# Patient Record
Sex: Female | Born: 1935 | Race: White | Hispanic: No | Marital: Married | Smoking: Never smoker
Health system: Southern US, Community
[De-identification: ages and names within clinical notes are randomized; demographics above are authoritative.]

## PROBLEM LIST (undated history)

## (undated) DIAGNOSIS — G912 (Idiopathic) normal pressure hydrocephalus: Secondary | ICD-10-CM

## (undated) DIAGNOSIS — B029 Zoster without complications: Secondary | ICD-10-CM

## (undated) DIAGNOSIS — B021 Zoster meningitis: Secondary | ICD-10-CM

## (undated) DIAGNOSIS — J8410 Pulmonary fibrosis, unspecified: Secondary | ICD-10-CM

## (undated) HISTORY — DX: Zoster meningitis: B02.1

## (undated) HISTORY — PX: BUNIONECTOMY: SHX129

## (undated) HISTORY — PX: VENTRICULOPERITONEAL SHUNT: SHX204

## (undated) HISTORY — DX: (Idiopathic) normal pressure hydrocephalus: G91.2

## (undated) HISTORY — DX: Zoster without complications: B02.9

## (undated) HISTORY — DX: Pulmonary fibrosis, unspecified: J84.10

## (undated) HISTORY — PX: REPLACEMENT TOTAL KNEE BILATERAL: SUR1225

---

## 1998-01-28 ENCOUNTER — Ambulatory Visit (HOSPITAL_COMMUNITY): Admission: RE | Admit: 1998-01-28 | Discharge: 1998-01-28 | Payer: Self-pay | Admitting: Gynecology

## 1998-08-07 ENCOUNTER — Ambulatory Visit (HOSPITAL_COMMUNITY): Admission: RE | Admit: 1998-08-07 | Discharge: 1998-08-07 | Payer: Self-pay | Admitting: Gynecology

## 1999-08-10 ENCOUNTER — Encounter: Payer: Self-pay | Admitting: Gynecology

## 1999-08-10 ENCOUNTER — Ambulatory Visit (HOSPITAL_COMMUNITY): Admission: RE | Admit: 1999-08-10 | Discharge: 1999-08-10 | Payer: Self-pay | Admitting: Gynecology

## 2000-10-28 ENCOUNTER — Encounter: Payer: Self-pay | Admitting: Gynecology

## 2000-10-28 ENCOUNTER — Ambulatory Visit (HOSPITAL_COMMUNITY): Admission: RE | Admit: 2000-10-28 | Discharge: 2000-10-28 | Payer: Self-pay | Admitting: Gynecology

## 2000-11-11 ENCOUNTER — Other Ambulatory Visit: Admission: RE | Admit: 2000-11-11 | Discharge: 2000-11-11 | Payer: Self-pay | Admitting: Gynecology

## 2000-11-16 ENCOUNTER — Encounter: Payer: Self-pay | Admitting: Gynecology

## 2000-11-16 ENCOUNTER — Encounter: Admission: RE | Admit: 2000-11-16 | Discharge: 2000-11-16 | Payer: Self-pay | Admitting: Gynecology

## 2002-01-31 ENCOUNTER — Ambulatory Visit (HOSPITAL_COMMUNITY): Admission: RE | Admit: 2002-01-31 | Discharge: 2002-01-31 | Payer: Self-pay | Admitting: Family Medicine

## 2002-01-31 ENCOUNTER — Encounter: Payer: Self-pay | Admitting: Family Medicine

## 2002-02-07 ENCOUNTER — Other Ambulatory Visit: Admission: RE | Admit: 2002-02-07 | Discharge: 2002-02-07 | Payer: Self-pay | Admitting: Family Medicine

## 2002-02-14 ENCOUNTER — Encounter: Payer: Self-pay | Admitting: Family Medicine

## 2002-02-14 ENCOUNTER — Ambulatory Visit (HOSPITAL_COMMUNITY): Admission: RE | Admit: 2002-02-14 | Discharge: 2002-02-14 | Payer: Self-pay | Admitting: Family Medicine

## 2003-02-04 ENCOUNTER — Ambulatory Visit (HOSPITAL_COMMUNITY): Admission: RE | Admit: 2003-02-04 | Discharge: 2003-02-04 | Payer: Self-pay | Admitting: *Deleted

## 2003-02-04 ENCOUNTER — Encounter: Payer: Self-pay | Admitting: *Deleted

## 2003-08-14 ENCOUNTER — Encounter: Payer: Self-pay | Admitting: Otolaryngology

## 2003-08-14 ENCOUNTER — Inpatient Hospital Stay (HOSPITAL_COMMUNITY): Admission: AD | Admit: 2003-08-14 | Discharge: 2003-08-27 | Payer: Self-pay

## 2003-08-26 ENCOUNTER — Encounter: Payer: Self-pay | Admitting: Otolaryngology

## 2003-08-29 ENCOUNTER — Encounter: Admission: RE | Admit: 2003-08-29 | Discharge: 2003-11-27 | Payer: Self-pay | Admitting: Otolaryngology

## 2003-11-28 ENCOUNTER — Encounter: Admission: RE | Admit: 2003-11-28 | Discharge: 2004-01-20 | Payer: Self-pay | Admitting: Otolaryngology

## 2003-12-03 ENCOUNTER — Ambulatory Visit (HOSPITAL_COMMUNITY): Admission: RE | Admit: 2003-12-03 | Discharge: 2003-12-03 | Payer: Self-pay | Admitting: Family Medicine

## 2004-02-05 ENCOUNTER — Other Ambulatory Visit: Admission: RE | Admit: 2004-02-05 | Discharge: 2004-02-05 | Payer: Self-pay | Admitting: Family Medicine

## 2004-07-06 ENCOUNTER — Encounter (INDEPENDENT_AMBULATORY_CARE_PROVIDER_SITE_OTHER): Payer: Self-pay | Admitting: Specialist

## 2004-07-06 ENCOUNTER — Ambulatory Visit (HOSPITAL_COMMUNITY): Admission: RE | Admit: 2004-07-06 | Discharge: 2004-07-06 | Payer: Self-pay | Admitting: Gastroenterology

## 2004-09-21 ENCOUNTER — Encounter: Admission: RE | Admit: 2004-09-21 | Discharge: 2004-12-20 | Payer: Self-pay | Admitting: Otolaryngology

## 2004-11-11 ENCOUNTER — Ambulatory Visit: Payer: Self-pay | Admitting: Internal Medicine

## 2004-11-18 ENCOUNTER — Ambulatory Visit: Payer: Self-pay | Admitting: Internal Medicine

## 2004-11-25 ENCOUNTER — Ambulatory Visit: Payer: Self-pay | Admitting: Internal Medicine

## 2004-12-02 ENCOUNTER — Ambulatory Visit: Payer: Self-pay | Admitting: Internal Medicine

## 2004-12-09 ENCOUNTER — Ambulatory Visit: Payer: Self-pay | Admitting: Internal Medicine

## 2004-12-18 ENCOUNTER — Ambulatory Visit: Payer: Self-pay | Admitting: Internal Medicine

## 2004-12-21 ENCOUNTER — Encounter: Admission: RE | Admit: 2004-12-21 | Discharge: 2005-01-19 | Payer: Self-pay | Admitting: Otolaryngology

## 2004-12-25 ENCOUNTER — Ambulatory Visit: Payer: Self-pay | Admitting: Internal Medicine

## 2005-01-08 ENCOUNTER — Ambulatory Visit: Payer: Self-pay | Admitting: Internal Medicine

## 2005-01-11 ENCOUNTER — Ambulatory Visit: Payer: Self-pay | Admitting: Internal Medicine

## 2005-01-27 ENCOUNTER — Ambulatory Visit: Payer: Self-pay | Admitting: Internal Medicine

## 2005-02-10 ENCOUNTER — Ambulatory Visit: Payer: Self-pay | Admitting: Internal Medicine

## 2005-02-18 ENCOUNTER — Ambulatory Visit: Payer: Self-pay | Admitting: Internal Medicine

## 2005-02-24 ENCOUNTER — Ambulatory Visit: Payer: Self-pay | Admitting: Internal Medicine

## 2005-03-03 ENCOUNTER — Ambulatory Visit: Payer: Self-pay | Admitting: Internal Medicine

## 2005-03-10 ENCOUNTER — Ambulatory Visit: Payer: Self-pay | Admitting: Internal Medicine

## 2005-03-26 ENCOUNTER — Ambulatory Visit: Payer: Self-pay | Admitting: Internal Medicine

## 2005-04-02 ENCOUNTER — Ambulatory Visit: Payer: Self-pay | Admitting: Internal Medicine

## 2005-04-09 ENCOUNTER — Ambulatory Visit: Payer: Self-pay | Admitting: Internal Medicine

## 2005-04-16 ENCOUNTER — Ambulatory Visit: Payer: Self-pay | Admitting: Internal Medicine

## 2005-04-22 ENCOUNTER — Ambulatory Visit: Payer: Self-pay | Admitting: Internal Medicine

## 2005-04-28 ENCOUNTER — Ambulatory Visit: Payer: Self-pay | Admitting: Internal Medicine

## 2005-05-06 ENCOUNTER — Ambulatory Visit: Payer: Self-pay | Admitting: Internal Medicine

## 2005-05-14 ENCOUNTER — Ambulatory Visit: Payer: Self-pay | Admitting: Internal Medicine

## 2005-05-19 ENCOUNTER — Ambulatory Visit: Payer: Self-pay | Admitting: Internal Medicine

## 2005-05-28 ENCOUNTER — Ambulatory Visit: Payer: Self-pay | Admitting: Internal Medicine

## 2005-05-31 ENCOUNTER — Ambulatory Visit: Payer: Self-pay | Admitting: Internal Medicine

## 2005-06-04 ENCOUNTER — Ambulatory Visit (HOSPITAL_COMMUNITY): Admission: RE | Admit: 2005-06-04 | Discharge: 2005-06-04 | Payer: Self-pay | Admitting: Family Medicine

## 2005-06-04 ENCOUNTER — Ambulatory Visit: Payer: Self-pay | Admitting: Internal Medicine

## 2005-06-17 ENCOUNTER — Ambulatory Visit: Payer: Self-pay | Admitting: Internal Medicine

## 2005-06-23 ENCOUNTER — Ambulatory Visit: Payer: Self-pay | Admitting: Internal Medicine

## 2005-07-02 ENCOUNTER — Ambulatory Visit: Payer: Self-pay | Admitting: Internal Medicine

## 2005-07-07 ENCOUNTER — Ambulatory Visit: Payer: Self-pay | Admitting: Internal Medicine

## 2005-07-16 ENCOUNTER — Ambulatory Visit: Payer: Self-pay | Admitting: Internal Medicine

## 2005-07-23 ENCOUNTER — Ambulatory Visit: Payer: Self-pay | Admitting: Internal Medicine

## 2005-07-29 ENCOUNTER — Ambulatory Visit: Payer: Self-pay | Admitting: Internal Medicine

## 2005-08-06 ENCOUNTER — Ambulatory Visit: Payer: Self-pay | Admitting: Internal Medicine

## 2005-08-17 ENCOUNTER — Ambulatory Visit: Payer: Self-pay | Admitting: Internal Medicine

## 2005-08-20 ENCOUNTER — Ambulatory Visit: Payer: Self-pay | Admitting: Internal Medicine

## 2005-08-27 ENCOUNTER — Ambulatory Visit: Payer: Self-pay | Admitting: Internal Medicine

## 2005-09-03 ENCOUNTER — Ambulatory Visit: Payer: Self-pay | Admitting: Internal Medicine

## 2005-09-10 ENCOUNTER — Ambulatory Visit: Payer: Self-pay | Admitting: Internal Medicine

## 2005-09-16 ENCOUNTER — Ambulatory Visit: Payer: Self-pay | Admitting: Internal Medicine

## 2005-09-24 ENCOUNTER — Ambulatory Visit: Payer: Self-pay | Admitting: Internal Medicine

## 2005-10-04 ENCOUNTER — Ambulatory Visit: Payer: Self-pay | Admitting: Internal Medicine

## 2005-10-15 ENCOUNTER — Ambulatory Visit: Payer: Self-pay | Admitting: Internal Medicine

## 2005-10-22 ENCOUNTER — Ambulatory Visit: Payer: Self-pay | Admitting: Internal Medicine

## 2005-11-04 ENCOUNTER — Ambulatory Visit: Payer: Self-pay | Admitting: Internal Medicine

## 2005-11-05 ENCOUNTER — Ambulatory Visit: Payer: Self-pay | Admitting: Internal Medicine

## 2005-11-11 ENCOUNTER — Ambulatory Visit: Payer: Self-pay | Admitting: Internal Medicine

## 2005-11-18 ENCOUNTER — Ambulatory Visit: Payer: Self-pay | Admitting: Internal Medicine

## 2005-11-26 ENCOUNTER — Ambulatory Visit: Payer: Self-pay | Admitting: Internal Medicine

## 2005-12-13 ENCOUNTER — Ambulatory Visit: Payer: Self-pay | Admitting: Internal Medicine

## 2005-12-23 ENCOUNTER — Ambulatory Visit: Payer: Self-pay | Admitting: Internal Medicine

## 2005-12-29 ENCOUNTER — Ambulatory Visit: Payer: Self-pay | Admitting: Internal Medicine

## 2006-01-07 ENCOUNTER — Ambulatory Visit: Payer: Self-pay | Admitting: Internal Medicine

## 2006-01-12 ENCOUNTER — Ambulatory Visit: Payer: Self-pay | Admitting: Internal Medicine

## 2006-01-21 ENCOUNTER — Ambulatory Visit: Payer: Self-pay | Admitting: Internal Medicine

## 2006-01-28 ENCOUNTER — Ambulatory Visit: Payer: Self-pay | Admitting: Internal Medicine

## 2006-02-04 ENCOUNTER — Ambulatory Visit: Payer: Self-pay | Admitting: Internal Medicine

## 2006-02-10 ENCOUNTER — Ambulatory Visit: Payer: Self-pay | Admitting: Internal Medicine

## 2006-02-18 ENCOUNTER — Ambulatory Visit: Payer: Self-pay | Admitting: Internal Medicine

## 2006-02-24 ENCOUNTER — Ambulatory Visit: Payer: Self-pay | Admitting: Internal Medicine

## 2006-03-04 ENCOUNTER — Ambulatory Visit: Payer: Self-pay | Admitting: Internal Medicine

## 2006-03-11 ENCOUNTER — Ambulatory Visit: Payer: Self-pay | Admitting: Internal Medicine

## 2006-03-22 ENCOUNTER — Ambulatory Visit: Payer: Self-pay | Admitting: Internal Medicine

## 2006-04-01 ENCOUNTER — Ambulatory Visit: Payer: Self-pay | Admitting: Internal Medicine

## 2006-04-05 ENCOUNTER — Ambulatory Visit: Payer: Self-pay | Admitting: Internal Medicine

## 2006-04-08 ENCOUNTER — Ambulatory Visit: Payer: Self-pay | Admitting: Internal Medicine

## 2006-04-13 ENCOUNTER — Ambulatory Visit: Payer: Self-pay | Admitting: Internal Medicine

## 2006-04-22 ENCOUNTER — Ambulatory Visit: Payer: Self-pay | Admitting: Internal Medicine

## 2006-04-27 ENCOUNTER — Ambulatory Visit: Payer: Self-pay | Admitting: Internal Medicine

## 2006-05-06 ENCOUNTER — Ambulatory Visit: Payer: Self-pay | Admitting: Internal Medicine

## 2006-05-18 ENCOUNTER — Ambulatory Visit: Payer: Self-pay | Admitting: Internal Medicine

## 2006-05-26 ENCOUNTER — Ambulatory Visit: Payer: Self-pay | Admitting: Internal Medicine

## 2006-06-01 ENCOUNTER — Ambulatory Visit: Payer: Self-pay | Admitting: Internal Medicine

## 2006-06-09 ENCOUNTER — Ambulatory Visit: Admission: RE | Admit: 2006-06-09 | Discharge: 2006-06-09 | Payer: Self-pay | Admitting: Family Medicine

## 2006-06-09 ENCOUNTER — Ambulatory Visit: Payer: Self-pay | Admitting: Internal Medicine

## 2006-06-14 ENCOUNTER — Ambulatory Visit: Payer: Self-pay | Admitting: Internal Medicine

## 2006-06-23 ENCOUNTER — Ambulatory Visit: Payer: Self-pay | Admitting: Internal Medicine

## 2006-06-29 ENCOUNTER — Other Ambulatory Visit: Admission: RE | Admit: 2006-06-29 | Discharge: 2006-06-29 | Payer: Self-pay | Admitting: Family Medicine

## 2006-07-01 ENCOUNTER — Ambulatory Visit: Payer: Self-pay | Admitting: Internal Medicine

## 2006-07-06 ENCOUNTER — Ambulatory Visit: Payer: Self-pay | Admitting: Internal Medicine

## 2006-07-14 ENCOUNTER — Ambulatory Visit: Payer: Self-pay | Admitting: Internal Medicine

## 2006-07-21 ENCOUNTER — Ambulatory Visit: Payer: Self-pay | Admitting: Internal Medicine

## 2006-07-28 ENCOUNTER — Ambulatory Visit: Payer: Self-pay | Admitting: Internal Medicine

## 2006-07-29 ENCOUNTER — Ambulatory Visit: Payer: Self-pay | Admitting: Internal Medicine

## 2006-08-05 ENCOUNTER — Ambulatory Visit: Payer: Self-pay | Admitting: Internal Medicine

## 2006-08-12 ENCOUNTER — Ambulatory Visit: Payer: Self-pay | Admitting: Internal Medicine

## 2006-08-17 ENCOUNTER — Ambulatory Visit: Payer: Self-pay | Admitting: Internal Medicine

## 2006-08-24 ENCOUNTER — Ambulatory Visit: Payer: Self-pay | Admitting: Internal Medicine

## 2006-09-01 ENCOUNTER — Ambulatory Visit: Payer: Self-pay | Admitting: Internal Medicine

## 2006-09-09 ENCOUNTER — Ambulatory Visit: Payer: Self-pay | Admitting: Internal Medicine

## 2006-09-16 ENCOUNTER — Ambulatory Visit: Payer: Self-pay | Admitting: Internal Medicine

## 2006-09-23 ENCOUNTER — Ambulatory Visit: Payer: Self-pay | Admitting: Internal Medicine

## 2006-10-04 ENCOUNTER — Ambulatory Visit: Payer: Self-pay | Admitting: Internal Medicine

## 2006-10-20 ENCOUNTER — Ambulatory Visit: Payer: Self-pay | Admitting: Internal Medicine

## 2006-10-28 ENCOUNTER — Ambulatory Visit: Payer: Self-pay | Admitting: Internal Medicine

## 2006-11-04 ENCOUNTER — Ambulatory Visit: Payer: Self-pay | Admitting: Internal Medicine

## 2006-11-07 ENCOUNTER — Ambulatory Visit: Payer: Self-pay | Admitting: Internal Medicine

## 2006-11-14 ENCOUNTER — Ambulatory Visit: Payer: Self-pay | Admitting: Internal Medicine

## 2006-11-25 ENCOUNTER — Ambulatory Visit: Payer: Self-pay | Admitting: Internal Medicine

## 2006-11-28 ENCOUNTER — Ambulatory Visit: Payer: Self-pay | Admitting: Internal Medicine

## 2006-12-01 ENCOUNTER — Ambulatory Visit: Payer: Self-pay | Admitting: Internal Medicine

## 2006-12-09 ENCOUNTER — Ambulatory Visit: Payer: Self-pay | Admitting: Internal Medicine

## 2006-12-15 ENCOUNTER — Ambulatory Visit: Payer: Self-pay | Admitting: Internal Medicine

## 2006-12-23 ENCOUNTER — Ambulatory Visit: Payer: Self-pay | Admitting: Internal Medicine

## 2006-12-29 ENCOUNTER — Ambulatory Visit: Payer: Self-pay | Admitting: Internal Medicine

## 2007-01-06 ENCOUNTER — Ambulatory Visit: Payer: Self-pay | Admitting: Internal Medicine

## 2007-01-11 ENCOUNTER — Ambulatory Visit: Payer: Self-pay | Admitting: Internal Medicine

## 2007-01-16 ENCOUNTER — Ambulatory Visit: Payer: Self-pay | Admitting: Internal Medicine

## 2007-01-26 ENCOUNTER — Ambulatory Visit: Payer: Self-pay | Admitting: Internal Medicine

## 2007-02-06 ENCOUNTER — Ambulatory Visit: Payer: Self-pay | Admitting: Internal Medicine

## 2007-02-15 ENCOUNTER — Ambulatory Visit: Payer: Self-pay | Admitting: Internal Medicine

## 2007-02-24 ENCOUNTER — Ambulatory Visit: Payer: Self-pay | Admitting: Internal Medicine

## 2007-02-28 ENCOUNTER — Ambulatory Visit: Payer: Self-pay | Admitting: Internal Medicine

## 2007-03-08 ENCOUNTER — Ambulatory Visit: Payer: Self-pay | Admitting: Internal Medicine

## 2007-03-16 ENCOUNTER — Ambulatory Visit: Payer: Self-pay | Admitting: Internal Medicine

## 2007-03-24 ENCOUNTER — Ambulatory Visit: Payer: Self-pay | Admitting: Internal Medicine

## 2007-03-30 ENCOUNTER — Ambulatory Visit: Payer: Self-pay | Admitting: Internal Medicine

## 2007-04-11 ENCOUNTER — Ambulatory Visit: Payer: Self-pay | Admitting: Internal Medicine

## 2007-04-17 ENCOUNTER — Ambulatory Visit: Payer: Self-pay | Admitting: Internal Medicine

## 2007-04-21 ENCOUNTER — Ambulatory Visit: Payer: Self-pay | Admitting: Internal Medicine

## 2007-04-26 ENCOUNTER — Ambulatory Visit: Payer: Self-pay | Admitting: Internal Medicine

## 2007-05-05 ENCOUNTER — Ambulatory Visit: Payer: Self-pay | Admitting: Internal Medicine

## 2007-05-10 ENCOUNTER — Ambulatory Visit: Payer: Self-pay | Admitting: Internal Medicine

## 2007-05-19 ENCOUNTER — Ambulatory Visit: Payer: Self-pay | Admitting: Internal Medicine

## 2007-05-26 ENCOUNTER — Ambulatory Visit: Payer: Self-pay | Admitting: Internal Medicine

## 2007-06-02 ENCOUNTER — Ambulatory Visit: Payer: Self-pay | Admitting: Internal Medicine

## 2007-06-09 ENCOUNTER — Ambulatory Visit: Payer: Self-pay | Admitting: Internal Medicine

## 2007-06-16 ENCOUNTER — Ambulatory Visit: Payer: Self-pay | Admitting: Internal Medicine

## 2007-06-22 ENCOUNTER — Ambulatory Visit: Payer: Self-pay | Admitting: Internal Medicine

## 2007-06-30 ENCOUNTER — Ambulatory Visit: Payer: Self-pay | Admitting: Internal Medicine

## 2007-07-07 ENCOUNTER — Ambulatory Visit: Payer: Self-pay | Admitting: Internal Medicine

## 2007-07-14 ENCOUNTER — Ambulatory Visit: Payer: Self-pay | Admitting: Internal Medicine

## 2007-07-20 ENCOUNTER — Ambulatory Visit: Payer: Self-pay | Admitting: Internal Medicine

## 2007-07-31 ENCOUNTER — Ambulatory Visit: Payer: Self-pay | Admitting: Internal Medicine

## 2007-08-10 ENCOUNTER — Ambulatory Visit: Payer: Self-pay | Admitting: Internal Medicine

## 2007-08-17 ENCOUNTER — Ambulatory Visit: Payer: Self-pay | Admitting: Internal Medicine

## 2007-08-25 ENCOUNTER — Ambulatory Visit: Payer: Self-pay | Admitting: Internal Medicine

## 2007-08-30 ENCOUNTER — Ambulatory Visit: Payer: Self-pay | Admitting: Internal Medicine

## 2007-08-30 DIAGNOSIS — J310 Chronic rhinitis: Secondary | ICD-10-CM | POA: Insufficient documentation

## 2007-08-30 DIAGNOSIS — J45909 Unspecified asthma, uncomplicated: Secondary | ICD-10-CM | POA: Insufficient documentation

## 2007-08-31 ENCOUNTER — Ambulatory Visit: Payer: Self-pay | Admitting: Internal Medicine

## 2007-09-08 ENCOUNTER — Ambulatory Visit: Payer: Self-pay | Admitting: Internal Medicine

## 2007-09-15 ENCOUNTER — Ambulatory Visit: Payer: Self-pay | Admitting: Internal Medicine

## 2007-09-22 ENCOUNTER — Ambulatory Visit: Payer: Self-pay | Admitting: Internal Medicine

## 2007-09-29 ENCOUNTER — Ambulatory Visit (HOSPITAL_COMMUNITY): Admission: RE | Admit: 2007-09-29 | Discharge: 2007-09-29 | Payer: Self-pay | Admitting: Family Medicine

## 2007-09-29 ENCOUNTER — Ambulatory Visit: Payer: Self-pay | Admitting: Internal Medicine

## 2007-10-12 ENCOUNTER — Ambulatory Visit: Payer: Self-pay | Admitting: Internal Medicine

## 2007-10-20 ENCOUNTER — Ambulatory Visit: Payer: Self-pay | Admitting: Internal Medicine

## 2007-10-23 ENCOUNTER — Encounter: Admission: RE | Admit: 2007-10-23 | Discharge: 2007-10-23 | Payer: Self-pay | Admitting: Orthopedic Surgery

## 2007-10-24 ENCOUNTER — Ambulatory Visit (HOSPITAL_BASED_OUTPATIENT_CLINIC_OR_DEPARTMENT_OTHER): Admission: RE | Admit: 2007-10-24 | Discharge: 2007-10-24 | Payer: Self-pay | Admitting: Orthopedic Surgery

## 2007-10-24 ENCOUNTER — Encounter (INDEPENDENT_AMBULATORY_CARE_PROVIDER_SITE_OTHER): Payer: Self-pay | Admitting: Orthopedic Surgery

## 2007-11-07 ENCOUNTER — Ambulatory Visit: Payer: Self-pay | Admitting: Internal Medicine

## 2007-11-17 ENCOUNTER — Ambulatory Visit: Payer: Self-pay | Admitting: Internal Medicine

## 2007-12-01 ENCOUNTER — Ambulatory Visit: Payer: Self-pay | Admitting: Internal Medicine

## 2007-12-19 ENCOUNTER — Ambulatory Visit: Payer: Self-pay | Admitting: Internal Medicine

## 2007-12-29 ENCOUNTER — Ambulatory Visit: Payer: Self-pay | Admitting: Internal Medicine

## 2008-01-12 ENCOUNTER — Ambulatory Visit: Payer: Self-pay | Admitting: Internal Medicine

## 2008-01-26 ENCOUNTER — Ambulatory Visit: Payer: Self-pay | Admitting: Internal Medicine

## 2008-02-02 ENCOUNTER — Ambulatory Visit: Payer: Self-pay | Admitting: Internal Medicine

## 2008-02-09 ENCOUNTER — Ambulatory Visit: Payer: Self-pay | Admitting: Internal Medicine

## 2008-02-15 ENCOUNTER — Ambulatory Visit: Payer: Self-pay | Admitting: Internal Medicine

## 2008-02-23 ENCOUNTER — Ambulatory Visit: Payer: Self-pay | Admitting: Internal Medicine

## 2008-03-07 ENCOUNTER — Ambulatory Visit: Payer: Self-pay | Admitting: Internal Medicine

## 2008-03-08 ENCOUNTER — Ambulatory Visit: Payer: Self-pay | Admitting: Internal Medicine

## 2008-03-21 ENCOUNTER — Ambulatory Visit: Payer: Self-pay | Admitting: Internal Medicine

## 2008-03-29 ENCOUNTER — Ambulatory Visit: Payer: Self-pay | Admitting: Internal Medicine

## 2008-04-12 ENCOUNTER — Ambulatory Visit: Payer: Self-pay | Admitting: Internal Medicine

## 2008-04-17 ENCOUNTER — Encounter: Payer: Self-pay | Admitting: Internal Medicine

## 2008-04-25 ENCOUNTER — Ambulatory Visit: Payer: Self-pay | Admitting: Internal Medicine

## 2008-05-03 ENCOUNTER — Ambulatory Visit: Payer: Self-pay | Admitting: Internal Medicine

## 2008-05-17 ENCOUNTER — Ambulatory Visit: Payer: Self-pay | Admitting: Internal Medicine

## 2008-05-24 ENCOUNTER — Ambulatory Visit: Payer: Self-pay | Admitting: Internal Medicine

## 2008-05-31 ENCOUNTER — Ambulatory Visit: Payer: Self-pay | Admitting: Internal Medicine

## 2008-06-07 ENCOUNTER — Ambulatory Visit: Payer: Self-pay | Admitting: Internal Medicine

## 2008-06-14 ENCOUNTER — Ambulatory Visit: Payer: Self-pay | Admitting: Internal Medicine

## 2008-06-27 ENCOUNTER — Ambulatory Visit: Payer: Self-pay | Admitting: Internal Medicine

## 2008-07-05 ENCOUNTER — Ambulatory Visit: Payer: Self-pay | Admitting: Internal Medicine

## 2008-07-12 ENCOUNTER — Ambulatory Visit: Payer: Self-pay | Admitting: Internal Medicine

## 2008-07-19 ENCOUNTER — Ambulatory Visit: Payer: Self-pay | Admitting: Internal Medicine

## 2008-08-02 ENCOUNTER — Ambulatory Visit: Payer: Self-pay | Admitting: Internal Medicine

## 2008-08-12 ENCOUNTER — Ambulatory Visit: Payer: Self-pay | Admitting: Pulmonary Disease

## 2008-08-12 ENCOUNTER — Ambulatory Visit: Payer: Self-pay | Admitting: Internal Medicine

## 2008-08-23 ENCOUNTER — Ambulatory Visit: Payer: Self-pay | Admitting: Internal Medicine

## 2008-08-30 ENCOUNTER — Ambulatory Visit: Payer: Self-pay | Admitting: Internal Medicine

## 2008-09-06 ENCOUNTER — Ambulatory Visit: Payer: Self-pay | Admitting: Internal Medicine

## 2008-09-18 ENCOUNTER — Ambulatory Visit: Payer: Self-pay | Admitting: Internal Medicine

## 2008-09-27 ENCOUNTER — Ambulatory Visit: Payer: Self-pay | Admitting: Internal Medicine

## 2008-09-30 ENCOUNTER — Other Ambulatory Visit: Admission: RE | Admit: 2008-09-30 | Discharge: 2008-09-30 | Payer: Self-pay | Admitting: Family Medicine

## 2008-10-02 ENCOUNTER — Ambulatory Visit (HOSPITAL_COMMUNITY): Admission: RE | Admit: 2008-10-02 | Discharge: 2008-10-02 | Payer: Self-pay | Admitting: Family Medicine

## 2008-10-07 ENCOUNTER — Ambulatory Visit: Payer: Self-pay | Admitting: Internal Medicine

## 2008-10-10 ENCOUNTER — Ambulatory Visit: Payer: Self-pay | Admitting: Pulmonary Disease

## 2008-10-10 ENCOUNTER — Ambulatory Visit: Payer: Self-pay | Admitting: Internal Medicine

## 2008-10-18 ENCOUNTER — Ambulatory Visit: Payer: Self-pay | Admitting: Internal Medicine

## 2008-10-25 ENCOUNTER — Ambulatory Visit: Payer: Self-pay | Admitting: Internal Medicine

## 2008-11-15 ENCOUNTER — Ambulatory Visit: Payer: Self-pay | Admitting: Internal Medicine

## 2008-11-20 ENCOUNTER — Ambulatory Visit: Payer: Self-pay | Admitting: Internal Medicine

## 2008-11-29 ENCOUNTER — Ambulatory Visit: Payer: Self-pay | Admitting: Internal Medicine

## 2008-12-06 ENCOUNTER — Ambulatory Visit: Payer: Self-pay | Admitting: Internal Medicine

## 2008-12-20 ENCOUNTER — Ambulatory Visit: Payer: Self-pay | Admitting: Internal Medicine

## 2008-12-27 ENCOUNTER — Ambulatory Visit: Payer: Self-pay | Admitting: Internal Medicine

## 2008-12-30 ENCOUNTER — Ambulatory Visit: Payer: Self-pay | Admitting: Surgery

## 2009-01-09 ENCOUNTER — Ambulatory Visit: Payer: Self-pay | Admitting: Internal Medicine

## 2009-01-15 ENCOUNTER — Ambulatory Visit: Payer: Self-pay | Admitting: Internal Medicine

## 2009-01-24 ENCOUNTER — Ambulatory Visit: Payer: Self-pay | Admitting: Internal Medicine

## 2009-01-31 ENCOUNTER — Ambulatory Visit: Payer: Self-pay | Admitting: Internal Medicine

## 2009-02-06 ENCOUNTER — Ambulatory Visit: Payer: Self-pay | Admitting: Internal Medicine

## 2009-02-14 ENCOUNTER — Ambulatory Visit: Payer: Self-pay | Admitting: Internal Medicine

## 2009-02-20 ENCOUNTER — Ambulatory Visit: Payer: Self-pay | Admitting: Internal Medicine

## 2009-02-21 ENCOUNTER — Ambulatory Visit: Payer: Self-pay | Admitting: Internal Medicine

## 2009-02-27 ENCOUNTER — Ambulatory Visit: Payer: Self-pay | Admitting: Internal Medicine

## 2009-03-06 ENCOUNTER — Encounter: Payer: Self-pay | Admitting: Internal Medicine

## 2009-03-07 ENCOUNTER — Ambulatory Visit: Payer: Self-pay | Admitting: Internal Medicine

## 2009-03-21 ENCOUNTER — Ambulatory Visit: Payer: Self-pay | Admitting: Internal Medicine

## 2009-03-28 ENCOUNTER — Ambulatory Visit: Payer: Self-pay | Admitting: Internal Medicine

## 2009-04-02 ENCOUNTER — Ambulatory Visit: Payer: Self-pay | Admitting: Vascular Surgery

## 2009-04-03 ENCOUNTER — Ambulatory Visit: Payer: Self-pay | Admitting: Internal Medicine

## 2009-04-11 ENCOUNTER — Ambulatory Visit: Payer: Self-pay | Admitting: Internal Medicine

## 2009-04-15 ENCOUNTER — Ambulatory Visit: Payer: Self-pay | Admitting: Internal Medicine

## 2009-04-25 ENCOUNTER — Ambulatory Visit: Payer: Self-pay | Admitting: Internal Medicine

## 2009-05-01 ENCOUNTER — Ambulatory Visit: Payer: Self-pay | Admitting: Internal Medicine

## 2009-05-09 ENCOUNTER — Ambulatory Visit: Payer: Self-pay | Admitting: Internal Medicine

## 2009-05-16 ENCOUNTER — Ambulatory Visit: Payer: Self-pay | Admitting: Internal Medicine

## 2009-05-21 ENCOUNTER — Ambulatory Visit: Payer: Self-pay | Admitting: Internal Medicine

## 2009-05-28 ENCOUNTER — Ambulatory Visit: Payer: Self-pay | Admitting: Internal Medicine

## 2009-06-06 ENCOUNTER — Ambulatory Visit: Payer: Self-pay | Admitting: Internal Medicine

## 2009-06-09 ENCOUNTER — Ambulatory Visit: Payer: Self-pay | Admitting: Internal Medicine

## 2009-06-13 ENCOUNTER — Ambulatory Visit: Payer: Self-pay | Admitting: Internal Medicine

## 2009-06-16 ENCOUNTER — Inpatient Hospital Stay (HOSPITAL_COMMUNITY): Admission: RE | Admit: 2009-06-16 | Discharge: 2009-06-20 | Payer: Self-pay | Admitting: Orthopedic Surgery

## 2009-07-16 ENCOUNTER — Encounter: Admission: RE | Admit: 2009-07-16 | Discharge: 2009-09-12 | Payer: Self-pay | Admitting: Orthopedic Surgery

## 2009-09-12 ENCOUNTER — Encounter: Payer: Self-pay | Admitting: Internal Medicine

## 2009-09-12 ENCOUNTER — Ambulatory Visit: Payer: Self-pay | Admitting: Internal Medicine

## 2009-09-24 ENCOUNTER — Telehealth: Payer: Self-pay | Admitting: Internal Medicine

## 2009-09-29 ENCOUNTER — Inpatient Hospital Stay (HOSPITAL_COMMUNITY): Admission: RE | Admit: 2009-09-29 | Discharge: 2009-10-03 | Payer: Self-pay | Admitting: Orthopedic Surgery

## 2009-11-04 ENCOUNTER — Encounter: Admission: RE | Admit: 2009-11-04 | Discharge: 2009-11-07 | Payer: Self-pay | Admitting: Orthopedic Surgery

## 2009-11-10 ENCOUNTER — Encounter: Admission: RE | Admit: 2009-11-10 | Discharge: 2010-02-08 | Payer: Self-pay | Admitting: Orthopedic Surgery

## 2009-12-05 ENCOUNTER — Ambulatory Visit (HOSPITAL_COMMUNITY)
Admission: RE | Admit: 2009-12-05 | Discharge: 2009-12-05 | Payer: Self-pay | Source: Home / Self Care | Admitting: Family Medicine

## 2010-01-22 ENCOUNTER — Ambulatory Visit: Payer: Self-pay | Admitting: Internal Medicine

## 2010-01-22 DIAGNOSIS — J42 Unspecified chronic bronchitis: Secondary | ICD-10-CM

## 2010-03-05 ENCOUNTER — Ambulatory Visit: Payer: Self-pay | Admitting: Internal Medicine

## 2010-08-05 ENCOUNTER — Encounter
Admission: RE | Admit: 2010-08-05 | Discharge: 2010-11-03 | Payer: Self-pay | Source: Home / Self Care | Attending: Family Medicine | Admitting: Family Medicine

## 2010-09-01 ENCOUNTER — Ambulatory Visit: Payer: Self-pay | Admitting: Internal Medicine

## 2010-09-02 DIAGNOSIS — G0491 Myelitis, unspecified: Secondary | ICD-10-CM

## 2010-09-02 DIAGNOSIS — G0490 Encephalitis and encephalomyelitis, unspecified: Secondary | ICD-10-CM | POA: Insufficient documentation

## 2010-10-13 ENCOUNTER — Ambulatory Visit: Payer: Self-pay | Admitting: Internal Medicine

## 2010-10-21 ENCOUNTER — Telehealth (INDEPENDENT_AMBULATORY_CARE_PROVIDER_SITE_OTHER): Payer: Self-pay | Admitting: *Deleted

## 2010-10-28 ENCOUNTER — Ambulatory Visit (HOSPITAL_COMMUNITY)
Admission: RE | Admit: 2010-10-28 | Discharge: 2010-10-28 | Payer: Self-pay | Source: Home / Self Care | Attending: Neurology | Admitting: Neurology

## 2010-11-04 ENCOUNTER — Encounter
Admission: RE | Admit: 2010-11-04 | Discharge: 2010-11-04 | Payer: Self-pay | Source: Home / Self Care | Attending: Family Medicine | Admitting: Family Medicine

## 2010-11-12 ENCOUNTER — Encounter
Admission: RE | Admit: 2010-11-12 | Discharge: 2010-12-08 | Payer: Self-pay | Source: Home / Self Care | Attending: Family Medicine | Admitting: Family Medicine

## 2010-11-28 ENCOUNTER — Encounter: Payer: Self-pay | Admitting: Family Medicine

## 2010-12-04 ENCOUNTER — Ambulatory Visit
Admission: RE | Admit: 2010-12-04 | Discharge: 2010-12-04 | Payer: Self-pay | Source: Home / Self Care | Attending: Internal Medicine | Admitting: Internal Medicine

## 2010-12-08 NOTE — Assessment & Plan Note (Signed)
Summary: ROV 6 MONTHS///KP   Primary Provider/Referring Provider:  Laurann Montana-  Deboraha Sprang  CC:  6 month follow up, pt had bilateral knee preplacement this year, and c/o dry cough prod at times white in color.  History of Present Illness: January 22, 2010- Asthma Since last here she had both knees replaced without respiratory complication. After each surgery she spent time in SNF for rehab to help her balance with vestibular issues since meningitis.  She notres some dry cough as she continues Advair. Ran out of Singulair. Dry cough, no fever, no wheeze. Denies reflux or obvious postnasal drip. Can't complete a sentence without cough. Not on an ACEI. She loves Advair.  March 05, 2010- Asthma/ Chronic bronchitis She prefers the Advair diskus over the Advair inhaler. The mdi did not help her cough, for which she uses Ricola lozenges. Perles didn't help much. Early pollen season, and now [place] pollen have bothered her. Little sense of postnasal drip or of reflux. She had no respiratory problems with her knee replacements last year.  September 01, 2010- Asthma/ Chronic bronchitis, Hx encephalitis Got both knees replaced this year- without respiratory complication. . She had flare of vestibular dysfunction with the siurgery and is in "'vestibular rehab".  Her dry hacking cough improved while on antibiotics (and what other factors) but has recurred now. It gets worse with eating cold food, worse when first wakes up. Produces only scant clear mucus, worst in the morning. Denies reflux or aspiration. Has no rescue inhaler.    Preventive Screening-Counseling & Management  Alcohol-Tobacco     Smoking Status: never  Current Medications (verified): 1)  Advair Diskus 250-50 Mcg/dose  Misc (Fluticasone-Salmeterol) .... Two Times A Day/ 2)  Singulair 10 Mg  Tabs (Montelukast Sodium) .... One Qd 3)  Evista 60 Mg  Tabs (Raloxifene Hcl) .... One Qd 4)  Zoloft 100 Mg Tabs (Sertraline Hcl) .Marland Kitchen.. 1 Once Daily 5)   Vitamin D 16109 Unit Caps (Ergocalciferol) .... Take 1 By Mouth Weekly 6)  Tylenol Arthritis Pain 650 Mg Cr-Tabs (Acetaminophen) .... Use As Directed As Needed 7)  Naproxen 500 Mg Tabs (Naproxen) .... As Needed 8)  Advil 200 Mg Tabs (Ibuprofen) .... As Needed 9)  Wellbutrin 100 Mg Tabs (Bupropion Hcl) .Marland Kitchen.. 1 Once Daily 10)  Ibuprofen 800 Mg Tabs (Ibuprofen) .Marland Kitchen.. 1 Every 4-6 Hrs As Needed  Allergies (verified): 1)  ! Codeine 2)  ! Sulfa 3)  ! 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  No ETOH use. Married with 3 children.  Risk Factors: Smoking Status: never (09/01/2010)  Review of Systems      See HPI       The patient complains of shortness of breath with activity.  The patient denies shortness of breath at rest, productive cough, non-productive cough, coughing up blood, chest pain, irregular heartbeats, acid heartburn, indigestion, loss of appetite, weight change, abdominal pain, difficulty swallowing, sore throat, tooth/dental problems, headaches, sneezing, itching, hand/feet swelling, rash, change in color of mucus, and fever.    Vital Signs:  Patient profile:   77 year old female Height:      68 inches Weight:  162.6 pounds BMI:     24.81 O2 Sat:      97 % on Room air Pulse rate:   75 / minute BP sitting:   154 / 86  (left arm)  Vitals Entered By: Renold Genta RCP, LPN (September 01, 2010 10:26 AM)  O2 Flow:  Room air CC: 6 month follow up, pt had bilateral knee preplacement this year, c/o dry cough prod at times white  in color Is Patient Diabetic? No Comments Medications reviewed with patient Renold Genta RCP, LPN  September 01, 2010 10:26 AM    Physical Exam  Additional Exam:  GENERAL:  A/Ox3; pleasant & cooperative.NAD, uses a cane HEENT:  [place]/AT, EOM-wnl, PERRLA, droop left eye ,EACs-clear, TMs-wnl, NOSE-clear, THROAT-clear & wnl., mild hoarseness. NECK:  Supple w/ fair ROM; JVD- 1-2 cm; normal carotid impulses w/o bruits; no thyromegaly or nodules palpated; no lymphadenopathy. CHEST: hard cough, wheeze especially in the right.  HEART:  RRR, no m/r/g  heard ABDOMEN:  medium build EXT: Warm bilat,  , clubbing, pulses intact, edema left foot.  Skin: no rash/lesion  Neuro-decreased hearing left ear. droop left eye, uses walker.     Impression & Recommendations:  Problem # 1:  BRONCHITIS, CHRONIC (ICD-491.9)  Worse cough recently corresponds to some wheeze.  I will try to get away fronm the dry powder of Advair. We will give a sample of Dulera 200-5 for stronger steroid component. She doesn't think she is aspirating, but with her neurodeficits it is hard to be sure.  Problem # 2:  ASTHMA (ICD-493.90) As above.  Problem # 3:  Hx of ENCEPHALITIS (ICD-323.9) Neurologic deficits impact with episodes of vertigo and her motor instability. As a result she isn't trying to go fast enough to challenge her respiratory reserve.   Medications Added to Medication List This Visit: 1)  Zoloft 100 Mg Tabs (Sertraline hcl) .Marland Kitchen.. 1 once daily 2)  Wellbutrin 100 Mg Tabs (Bupropion hcl) .Marland Kitchen.. 1 once daily 3)  Ibuprofen 800 Mg Tabs (Ibuprofen) .Marland Kitchen.. 1 every 4-6 hrs as needed  Other Orders: Est. Patient Level III (54098)  Patient Instructions: 1)  Please schedule a follow-up appointment in 1 month. 2)  Try sample Dulera 200-5, 2 puffs and rinse mouth, twice daily 3)  Use this for now instead of Advair. If it helps we will get a script. If not just go back to Advair.

## 2010-12-08 NOTE — Miscellaneous (Signed)
Summary: Injection Record / [facility]    Injection Record / [facility]    Imported By: Lennie Odor 03/31/2010 15:22:30  _____________________________________________________________________  External Attachment:    Type:   Image     Comment:   External Document

## 2010-12-08 NOTE — Assessment & Plan Note (Signed)
Summary: per pt call/cb   [facility]  CC:  follow up-cough.  History of Present Illness:  02/21/09- Asthma Did well through winter. Now 3-4 weeks dry hack. Not aware of wheeze. Mucinex-dm helps cough but makes her sleepy. Can't remember benzonatate. Using Ricola. codeine makes her nauseated. She feels this is seasonal and ok if she can control the cough. does use Advair. Not aware of reflux.  04/03/09- Asthma After difficult early Spring pollen season she now feels settled down. She prefers Advair over Symbicort. Disliked benzonatate which made her sleepy and unsteady- blames her prior herpes meningitis. Instead she is using Mucinex-DM at bedtime for occasional hacking cough.She does have variable cough most days, very scant white mucus. Anticipating knee surgery- needs clearance   For Dr Jonell Cluck CXR-  Stable COPD, RML scarring. PFT- WNL with some neurologic (?)  delay in initiation of flow.  January 22, 2010- Asthma Since last here she had both knees replaced without respiratory complication. After each surgery she spent time in SNF for rehab to help her balance with vestibular issues since meningitis.  She notres some dry cough as she continues Advair. Ran out of Singulair. Dry cough, no fever, no wheeze. Denies reflux or obvious postnasal drip. Can't complete a sentence without cough. Not on an ACEI. She loves Advair.     Current Medications (verified): 1)  Advair Diskus 250-50 Mcg/dose  Misc (Fluticasone-Salmeterol) .... Two Times A Day/ 2)  Singulair 10 Mg  Tabs (Montelukast Sodium) .... One Qd 3)  Evista 60 Mg  Tabs (Raloxifene Hcl) .... One Qd 4)  Zoloft 50 Mg  Tabs (Sertraline Hcl) .... One Qd 5)  Allergy Vaccine Gh 1:10 .... Use Per Schedule 6)  Vitamin D 16109 Unit Caps (Ergocalciferol) .... Take 1 By Mouth Weekly 7)  Tylenol Arthritis Pain 650 Mg Cr-Tabs (Acetaminophen) .... Use As Directed As Needed 8)  Naproxen 500 Mg Tabs  (Naproxen) .... As Needed 9)  Advil 200 Mg Tabs (Ibuprofen) .... As Needed  Allergies (verified): 1)  ! Codeine 2)  ! Sulfa 3)  ! 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  No ETOH use. Married with 3 children.  Risk Factors: Smoking Status: never (04/25/2008)  Review of Systems      See HPI       The patient complains of prolonged cough.  The patient denies anorexia, fever, weight loss, weight gain, vision loss, decreased hearing, hoarseness, chest pain, syncope, dyspnea on exertion, peripheral edema, headaches, hemoptysis, abdominal pain, and severe indigestion/heartburn.    Vital Signs:  Patient profile:   77 year old female Height:      68.75 inches Weight:      167 pounds BMI:     24.93 O2 Sat:      96 % on Room air Pulse rate:   107 / minute BP sitting:   138 / 80  (left arm) Cuff size:   regular  Vitals Entered By: Reynaldo Minium CMA (January 22, 2010 4:12 PM)  O2 Flow:  Room air  Physical Exam  Additional Exam:  GENERAL:  A/Ox3; pleasant & cooperative.NAD, uses a cane HEENT:  [place]/AT, EOM-wnl, PERRLA, droop left eye ,EACs-clear, TMs-wnl, NOSE-clear, THROAT-clear & wnl., mild hoarseness. NECK:  Supple w/ fair ROM; JVD- 1-2 cm; normal carotid impulses w/o bruits; no thyromegaly or nodules palpated; no lymphadenopathy. CHEST: dry cough with slight rattle, unlabored HEART:  RRR, no m/r/g  heard ABDOMEN:  Soft & nt; nml bowel sounds; no organomegaly or masses detected. EXT: Warm bilat,  , clubbing, pulses intact, edema left foot. Negative Homan's, walks  with cane Skin: no rash/lesion  Neuro-decreased hearing left ear.    Impression & Recommendations:  Problem # 1:  ASTHMA (ICD-493.90) Chronic asthmatic bronchitis. We will renew Singulair. To try away from dry powder she will try Advair HFA. Benzonatate didn't help . Mucinex helps but makes her drowsy. She has been off of allergy shots for almost a year and will stay off for now.  Medications Added to Medication List This Visit: 1)  Naproxen 500 Mg Tabs (Naproxen) .... As needed 2)  Advil 200 Mg Tabs (Ibuprofen) .... As needed  Other Orders: Est. Patient Level III (95621)  Patient Instructions: 1)  Please schedule a follow-up appointment in 1 month. 2)  Singulair refilled. 3)  Sample Advair 230/21: 2 puffs and rinse mouth twice daily. Try this instead of the dry poweer Advair for a couple of weeks to see if that helps the cough Prescriptions: SINGULAIR 10 MG  TABS (MONTELUKAST SODIUM) one qd  #90 x 3   Entered and Authorized by:   Waymon Budge MD   Signed by:   Waymon Budge MD on 01/22/2010   Method used:   Print then Give to Patient   RxID:   [id number]

## 2010-12-08 NOTE — Assessment & Plan Note (Signed)
Summary: rov ///kp   Primary Provider/Referring Provider:  Laurann Montana-  Deboraha Sprang  CC:  Follow up visit.  History of Present Illness:  04/03/09- Asthma After difficult early Spring pollen season she now feels settled down. She prefers Advair over Symbicort. Disliked benzonatate which made her sleepy and unsteady- blames her prior herpes meningitis. Instead she is using Mucinex-DM at bedtime for occasional hacking cough.She does have variable cough most days, very scant white mucus. Anticipating knee surgery- needs clearance   For Dr Jonell Cluck CXR-  Stable COPD, RML scarring. PFT- WNL with some neurologic (?)  delay in initiation of flow.  January 22, 2010- Asthma Since last here she had both knees replaced without respiratory complication. After each surgery she spent time in SNF for rehab to help her balance with vestibular issues since meningitis.  She notres some dry cough as she continues Advair. Ran out of Singulair. Dry cough, no fever, no wheeze. Denies reflux or obvious postnasal drip. Can't complete a sentence without cough. Not on an ACEI. She loves Advair.  March 05, 2010- Asthma/ Chronic bronchitis She prefers the Advair diskus over the Advair inhaler. The mdi did not help her cough, for which she uses Ricola lozenges. Perles didn't help much. Early pollen season, and now [place] pollen have bothered her. Little sense of postnasal drip or of reflux. She had no respiratory problems with her knee replacements last year.       Current Medications (verified): 1)  Advair Diskus 250-50 Mcg/dose  Misc (Fluticasone-Salmeterol) .... Two Times A Day/ 2)  Singulair 10 Mg  Tabs (Montelukast Sodium) .... One Qd 3)  Evista 60 Mg  Tabs (Raloxifene Hcl) .... One Qd 4)  Zoloft 50 Mg  Tabs (Sertraline Hcl) .... One Qd 5)  Vitamin D 16109 Unit Caps (Ergocalciferol) .... Take 1 By Mouth Weekly 6)  Tylenol Arthritis Pain 650 Mg Cr-Tabs (Acetaminophen) .... Use As Directed As Needed 7)  Naproxen 500 Mg  Tabs (Naproxen) .... As Needed 8)  Advil 200 Mg Tabs (Ibuprofen) .... As Needed  Allergies (verified): 1)  ! Codeine 2)  ! Sulfa 3)  ! 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  No ETOH use. Married with 3 children.  Risk Factors: Smoking Status: never (04/25/2008)  Past Surgical History: Bunionectomy Bilateral knee replacements  Review of Systems      See HPI       The patient complains of prolonged cough.  The patient denies anorexia, fever, weight loss, weight gain, vision loss, decreased hearing, hoarseness, chest pain, syncope, dyspnea on exertion, peripheral edema, headaches, hemoptysis, and severe indigestion/heartburn.    Vital Signs:  Patient profile:   77 year old female Height:      68.75 inches Weight:      165.13 pounds BMI:     24.65 O2 Sat:      97 % on Room air Pulse rate:   101 / minute BP sitting:   136 / 84  (left arm) Cuff size:   regular  Vitals Entered By: Reynaldo Minium CMA (March 05, 2010 2:53 PM)  O2 Flow:  Room air  Physical Exam  Additional Exam:  GENERAL:  A/Ox3; pleasant & cooperative.NAD, uses a cane HEENT:  [place]/AT, EOM-wnl, PERRLA, droop left eye ,EACs-clear, TMs-wnl, NOSE-clear, THROAT-clear & wnl., mild hoarseness. NECK:  Supple w/ fair ROM; JVD- 1-2 cm; normal carotid impulses w/o bruits; no thyromegaly or nodules palpated; no lymphadenopathy. CHEST: clear to P&A HEART:  RRR, no m/r/g  heard ABDOMEN:  Soft & nt; nml bowel sounds; no organomegaly or masses detected. EXT: Warm bilat,  , clubbing, pulses intact, edema left foot.  Skin: no rash/lesion  Neuro-decreased hearing left  ear.    Impression & Recommendations:  Problem # 1:  BRONCHITIS, CHRONIC (ICD-491.9) Chronic tracheobronchitis. She doesn't seem to be aspirating actively, but there may be some mild laryngeal incompetence that will be more evident in the future. Robitussin seems sufficient. We will refill the Advair discus.  Other Orders: Est. Patient Level III (04540)  Patient Instructions: 1)  Please schedule a follow-up appointment in 6 months. 2)  Script for advair diskus Prescriptions: ADVAIR DISKUS 250-50 MCG/DOSE  MISC (FLUTICASONE-SALMETEROL) two times a day/  #3 x 3   Entered and Authorized by:   Waymon Budge MD   Signed by:   Waymon Budge MD on 03/05/2010   Method used:   Print then Give to Patient   RxID:   [id number]  3

## 2010-12-08 NOTE — Miscellaneous (Signed)
Summary: Injection Record / [facility]    Injection Record /  Allergy    Imported By: Lennie Odor 07/10/2010 11:46:02  _____________________________________________________________________  External Attachment:    Type:   Image     Comment:   External Document

## 2010-12-08 NOTE — Assessment & Plan Note (Signed)
Summary: 1 month return/mhh   Primary Provider/Referring Provider:  Laurann Montana-  [facility]  CC:  C.  History of Present Illness: March 05, 2010- Asthma/ Chronic bronchitis She prefers the Advair diskus over the Advair inhaler. The mdi did not help her cough, for which she uses Ricola lozenges. Perles didn't help much. Early pollen season, and now [place] pollen have bothered her. Little sense of postnasal drip or of reflux. She had no respiratory problems with her knee replacements last year.  September 01, 2010-  Asthma/ Chronic bronchitis Got both knees replaced this year- without respiratory complication. . She had flare of vestibular dysfunction with the siurgery and is in "'vestibular rehab".  Her dry hacking cough improved while on antibiotics (and what other factors) but has recurred now. It gets worse with eating cold food, worse when first wakes up. Produces only scant clear mucus, worst in the morning. Denies reflux or aspiration. Has no rescue inhaler.  October 13, 2010-- Asthma/ Chronic bronchitis She says most of her issues are with dizziness, neurological, not with her breathing.  Dulera sample last visit didn't offer any real advantage, taken faithfully. Throat tickle cough. Ice cream/ cold starts her coughing. Morning cough few minutes. Scant to no clear mucus. Denies shortness of breath. Some changes with season and weather. Cough is biggest problem. Perles didn't help. Riccola helps. Very intolerant of codeine. Robitussin Dm is adequate if needed. Went back on Singulair but unsure of effect.  Doing rehab for vestibular dysfunction.          Asthma History    Initial Asthma Severity Rating:    Age range: 12+ years    Symptoms: >2 days/week; not daily    Nighttime Awakenings: 0-2/month    Interferes w/ normal activity: no limitations    SABA use (not for EIB): 0-2 days/week    Asthma Severity Assessment: Mild Persistent   Preventive Screening-Counseling &  Management  Alcohol-Tobacco     Smoking Status: never  Current Medications (verified): 1)  Advair Diskus 250-50 Mcg/dose  Misc (Fluticasone-Salmeterol) .... Two Times A Day/ 2)  Singulair 10 Mg  Tabs (Montelukast Sodium) .... One Qd 3)  Evista 60 Mg  Tabs (Raloxifene Hcl) .... One Qd 4)  Zoloft 100 Mg Tabs (Sertraline Hcl) .Marland Kitchen.. 1 Once Daily 5)  Vitamin D 16109 Unit Caps (Ergocalciferol) .... Take 1 By Mouth Weekly 6)  Tylenol Arthritis Pain 650 Mg Cr-Tabs (Acetaminophen) .... Use As Directed As Needed 7)  Naproxen 500 Mg Tabs (Naproxen) .... As Needed 8)  Advil 200 Mg Tabs (Ibuprofen) .... As Needed 9)  Wellbutrin 100 Mg Tabs (Bupropion Hcl) .Marland Kitchen.. 1 Once Daily 10)  Ibuprofen 800 Mg Tabs (Ibuprofen) .Marland Kitchen.. 1 Every 4-6 Hrs As Needed  Allergies (verified): 1)  ! Codeine 2)  ! Sulfa 3)  ! 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  No ETOH use. Married with 3 children.  Risk Factors: Smoking Status: never (10/13/2010)  Review of Systems      See HPI       The patient complains of non-productive cough.  The patient denies shortness of breath with activity, shortness of breath  at rest, productive cough, coughing up blood, chest pain, irregular heartbeats, acid heartburn, indigestion, loss of appetite, weight change, abdominal pain, difficulty swallowing, sore throat, tooth/dental problems, headaches, nasal congestion/difficulty breathing through nose, and sneezing.    Vital Signs:  Patient profile:   77 year old female Height:      68  inches Weight:      165.2 pounds BMI:     25.21 O2 Sat:      97 % on Room air Pulse rate:   84 / minute BP sitting:   150 / 80  (left arm)  Vitals Entered By: Renold Genta RCP, LPN (October 13, 2010 11:00 AM)  O2 Flow:  Room air CC: C Comments Medications reviewed with patient Renold Genta RCP, LPN  October 13, 2010 11:02 AM    Physical Exam  Additional Exam:  GENERAL:  A/Ox3; pleasant & cooperative.NAD, uses a cane HEENT:  [place]/AT, EOM-wnl, PERRLA, droop left eye ,EACs-clear, TMs-wnl, NOSE-clear, THROAT-clear & wnl., mild hoarseness. NECK:  Supple w/ fair ROM; JVD- 1-2 cm; normal carotid impulses w/o bruits; no thyromegaly or nodules palpated; no lymphadenopathy. CHEST: , wheeze especially in the right. Inspiratory squeeks across upper back HEART:  RRR, no m/r/g  heard ABDOMEN:  medium build EXT: Warm bilat,  , clubbing, pulses intact, edema left foot.  Skin: no rash/lesion  Neuro-decreased hearing left ear. droop left eye, uses walker.     Impression & Recommendations:  Problem # 1:  BRONCHITIS, CHRONIC (ICD-491.9)  Chronic bronchitis with RML scarring. She is very compliant with meds, but I'm not sure if we have anything that can make her life better.  We will let her try sample Spiriva for trial. We discussed cough control with honey.   Problem # 2:  Hx of ENCEPHALITIS (ICD-323.9) She continues physically limited. I have discussed her ability to cough effectively and to protect her airway.  Other Orders: Est. Patient Level III (81191)  Patient Instructions: 1)  Please schedule a follow-up appointment in 6 months. 2)  Try sample Spiriva once daily-see if that eases chest tightness and cough 3)  Try otc Biotine for dry mouth as needed.  4)  Ok to use Robitussin DM as needed

## 2010-12-10 NOTE — Progress Notes (Signed)
Summary: prescription  Phone Note Call from Patient Call back at Home Phone [phone]   Caller: Patient Call For: young Summary of Call: Pt states she received samples of spiriva on her last ov and it works well for her would like a prescription for this.//[facility] [address] Initial call taken by: Darletta Moll,  October 21, 2010 1:07 PM  Follow-up for Phone Call        called spoke with patient who states that the spiriva is working well for her - her morning cough is less "problematic" since beginning it - and would like a rx for this.  [facility]ing [facility] [address] and a 90day rx for [facility] mailed to her home.  CDY, i do not see in your OV note where it specifically states that she may have an rx if the med works for her.    [facility], and 90day supply printed off for CDY to sign.  pt's home address has been verified.  Follow-up by: Boone Master CNA/MA,  October 21, 2010 2:50 PM  Additional Follow-up for Phone Call Additional follow up Details #1::        mailed to patient as requested.Reynaldo Minium CMA  October 21, 2010 3:06 PM     New/Updated Medications: SPIRIVA HANDIHALER 18 MCG CAPS (TIOTROPIUM BROMIDE MONOHYDRATE) Inhale contents of 1 capsule once a day Prescriptions: SPIRIVA HANDIHALER 18 MCG CAPS (TIOTROPIUM BROMIDE MONOHYDRATE) Inhale contents of 1 capsule once a day  #90 x 3   Entered by:   Boone Master CNA/MA   Authorized by:   Waymon Budge MD   Signed by:   Boone Master CNA/MA on 10/21/2010   Method used:   Print then Mail to Patient   RxID:   [id number] SPIRIVA HANDIHALER 18 MCG CAPS (TIOTROPIUM BROMIDE MONOHYDRATE) Inhale contents of 1 capsule once a day  #30 x 0   Entered by:   Boone Master CNA/MA   Authorized by:   Waymon Budge MD   Signed by:   Boone Master CNA/MA on 10/21/2010   Method used:   Electronically to        [facility] [address] (retail)       [address]       [place], [place]  [address]       Ph: [phone]       Fax: [phone]   RxID:   [id number]

## 2010-12-11 NOTE — Miscellaneous (Signed)
Summary: Injection Record/[facility]  Injection Record/ Allergy   Imported By: Sherian Rein 03/12/2010 15:15:07  _____________________________________________________________________  External Attachment:    Type:   Image     Comment:   External Document

## 2010-12-16 NOTE — Assessment & Plan Note (Signed)
Summary: Brandi [name] ///KP   Primary Provider/Referring Provider:  Laurann Montana-  Deboraha Sprang  CC:  Follow up visit-asthma; increased dry cough..  History of Present Illness: September 01, 2010-  Asthma/ Chronic bronchitis Got both knees replaced this year- without respiratory complication. . She had flare of vestibular dysfunction with the siurgery and is in "'vestibular rehab".  Her dry hacking cough improved while on antibiotics (and what other factors) but has recurred now. It gets worse with eating cold food, worse when first wakes up. Produces only scant clear mucus, worst in the morning. Denies reflux or aspiration. Has no rescue inhaler.  October 13, 2010-- Asthma/ Chronic bronchitis She says most of her issues are with dizziness, neurological, not with her breathing.  Dulera sample last visit didn't offer any real advantage, taken faithfully. Throat tickle cough. Ice cream/ cold starts her coughing. Morning cough few minutes. Scant to no clear mucus. Denies shortness of breath. Some changes with season and weather. Cough is biggest problem. Perles didn't help. Riccola helps. Very intolerant of codeine. Robitussin Dm is adequate if needed. Went back on Singulair but unsure of effect.  Doing rehab for vestibular dysfunction.   December 04, 2010- Asthma/ Chronic bronchitis..........................Marland Kitchenhusband here Nurse-CC: Follow up visit-asthma; increased dry cough. Neither Spiriva or Dulera has affected her cough and may aggravate. Last week saw Dr Cliffton Asters.  Coughs on and off nonproductively but especially as she lies down or first wakes, or with cold food. - dry tickle. Denies chest tight or wheeze. Asks to retry Advair. Not aware of reflux. PFT normal in 2010.      Asthma History    Asthma Control Assessment:    Age range: 12+ years    Symptoms: throughout the day    Nighttime Awakenings: 0-2/month    Interferes w/ normal activity: no limitations    SABA use (not for EIB): >2 days/week  Asthma Control Assessment: Very Poorly Controlled   Preventive Screening-Counseling & Management  Alcohol-Tobacco     Smoking Status: never  Current Medications (verified): 1)  Advair Diskus 250-50 Mcg/dose  Misc (Fluticasone-Salmeterol) .... Two Times A Day/ 2)  Singulair 10 Mg  Tabs (Montelukast Sodium) .... One Qd 3)  Evista 60 Mg  Tabs (Raloxifene Hcl) .... One Qd 4)  Zoloft 100 Mg Tabs (Sertraline Hcl) .Marland Kitchen.. 1 Once Daily 5)  Vitamin D 62952 Unit Caps (Ergocalciferol) .... Take 1 By Mouth Weekly 6)  Advil 200 Mg Tabs (Ibuprofen) .... As Needed 7)  Wellbutrin 100 Mg Tabs (Bupropion Hcl) .Marland Kitchen.. 1 Once Daily 8)  Ibuprofen 800 Mg Tabs (Ibuprofen) .Marland Kitchen.. 1 Every 4-6 Hrs As Needed 9)  Spiriva Handihaler 18 Mcg Caps (Tiotropium Bromide Monohydrate) .... Inhale Contents of 1 Capsule Once A Day  Allergies (verified): 1)  ! Codeine 2)  ! Sulfa 3)  ! 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  No ETOH use. Married with 3 children.  Risk Factors: Smoking Status: never (12/04/2010)  Review of Systems      See HPI       The patient complains of non-productive cough.  The patient denies shortness of breath with activity, shortness of breath at  rest, productive cough, coughing up blood, chest pain, irregular heartbeats, acid heartburn, indigestion, loss of appetite, weight change, abdominal pain, difficulty swallowing, sore throat, tooth/dental problems, headaches, nasal congestion/difficulty breathing through nose, and sneezing.    Vital  Signs:  Patient profile:   77 year old female Height:      68 inches Weight:      164.25 pounds BMI:     25.06 O2 Sat:      95 % on Room air Pulse rate:   101 / minute BP sitting:   138 / 78  (left arm) Cuff size:   regular  Vitals Entered By: Reynaldo Minium CMA (December 04, 2010 3:52 PM)  O2 Flow:  Room air CC: Follow up visit-asthma; increased dry cough.   Physical Exam  Additional Exam:  GENERAL:  A/Ox3; pleasant & cooperative.NAD, walker HEENT:  [place]/AT, EOM-wnl, PERRLA, droop left eye ,EACs-clear, TMs-wnl, NOSE-clear, THROAT-clear & wnl., mild hoarseness. NECK:  Supple w/ fair ROM; JVD- 1-2 cm; normal carotid impulses w/o bruits; no thyromegaly or nodules palpated; no lymphadenopathy. CHEST: , wheeze especially in the right. Inspiratory squeeks transiently at left scapula HEART:  RRR, no m/r/g  heard ABDOMEN:  medium build EXT: Warm bilat,  , clubbing, pulses intact, edema left foot.  Skin: no rash/lesion  Neuro-decreased hearing left ear. droop left eye, uses walker.     Impression & Recommendations:  Problem # 1:  BRONCHITIS, CHRONIC (ICD-491.9) Her primary complaint is cough, but some audible wheeze points to an asthma pattern etiology.  Chronic asthmatic bronchits. I don't get the story that she is refluxing or having much postnasal drip. She cannot tolerate narcotics and we think that tramadol would be like that. She is aftraid of trying gabapentin for cough She is willing to have some benzonatate and likes nmucinex if needed.  We discussed CT for evaluation of possible bronchiectasis/ atypical infection Spiriva no help and will be stopoped now  Problem # 2:  Hx of ENCEPHALITIS (ICD-323.9) Her significant residual limitations, especially vestibular problems evaluated at [facility], limit her acitivity more than her cough/ respiratory complaints.   Medications Added to Medication List This Visit: 1)  Benzonatate 100 Mg Caps (Benzonatate) .Marland Kitchen.. 1-2 three times a day as  needed cough  Other Orders: Est. Patient Level IV (16109) Prescription Created Electronically [phone])  Patient Instructions: 1)  Please schedule a follow-up appointment in 4 months. 2)  OK to use your preferred cough suppressants 3)  Script sent for benzonatate perles for cough 4)  Scripts printed for Advair and Singulair 5)  Stop Spiriva since it is ineffective 6)  Consider CT scan of chest in the future 7)  cc Dr Cliffton Asters Prescriptions: BENZONATATE 100 MG CAPS (BENZONATATE) 1-2 three times a day as needed cough  #30 x prn   Entered and Authorized by:   Waymon Budge MD   Signed by:   Waymon Budge MD on 12/04/2010   Method used:   Electronically to        [facility] [address] (retail)       [address]       [place], [place]  [address]       Ph: [phone]       Fax: [phone]   RxID:   [id number] SINGULAIR 10 MG  TABS (MONTELUKAST SODIUM) one qd  #90 x 3   Entered and Authorized by:   Waymon Budge MD   Signed by:  Waymon Budge MD on 12/04/2010   Method used:   Print then Give to Patient   RxID:   [id number] ADVAIR DISKUS 250-50 MCG/DOSE  MISC (FLUTICASONE-SALMETEROL) two times a day/  #3 x 3   Entered and Authorized by:   Waymon Budge MD   Signed by:   Waymon Budge MD on 12/04/2010   Method used:   Print then Give to Patient   RxID:   [id number] BENZONATATE 100 MG CAPS (BENZONATATE) 1-2 three times a day as needed cough  #30 x prn   Entered and Authorized by:   Waymon Budge MD   Signed by:   Waymon Budge MD on 12/04/2010   Method used:   Print then Give to Patient   RxID:   [id number]

## 2010-12-30 NOTE — Miscellaneous (Signed)
Summary: Injection Financial risk analyst   Imported By: Sherian Rein 12/23/2010 14:26:45  _____________________________________________________________________  External Attachment:    Type:   Image     Comment:   External Document

## 2011-02-08 ENCOUNTER — Ambulatory Visit: Payer: Self-pay | Admitting: Rehabilitative and Restorative Service Providers"

## 2011-02-10 LAB — BASIC METABOLIC PANEL
BUN: 10 mg/dL (ref 6–23)
BUN: 5 mg/dL — ABNORMAL LOW (ref 6–23)
CO2: 29 mEq/L (ref 19–32)
Calcium: 8.3 mg/dL — ABNORMAL LOW (ref 8.4–10.5)
Calcium: 8.3 mg/dL — ABNORMAL LOW (ref 8.4–10.5)
Chloride: 105 mEq/L (ref 96–112)
Chloride: 98 mEq/L (ref 96–112)
Creatinine, Ser: 0.55 mg/dL (ref 0.4–1.2)
Creatinine, Ser: 0.6 mg/dL (ref 0.4–1.2)
GFR calc Af Amer: >60 mL/min (ref >60)
GFR calc non Af Amer: >60 mL/min (ref >60)
GFR calc non Af Amer: >60 mL/min (ref >60)
GFR calc non Af Amer: >60 mL/min (ref >60)
Glucose, Bld: 103 mg/dL — ABNORMAL HIGH (ref 70–99)
Glucose, Bld: 106 mg/dL — ABNORMAL HIGH (ref 70–99)
Glucose, Bld: 108 mg/dL — ABNORMAL HIGH (ref 70–99)
Glucose, Bld: 125 mg/dL — ABNORMAL HIGH (ref 70–99)
Potassium: 3.5 mEq/L (ref 3.5–5.1)
Potassium: 3.7 mEq/L (ref 3.5–5.1)
Sodium: 132 mEq/L — ABNORMAL LOW (ref 135–145)
Sodium: 137 mEq/L (ref 135–145)
Sodium: 138 mEq/L (ref 135–145)

## 2011-02-10 LAB — COMPREHENSIVE METABOLIC PANEL
ALT: 14 U/L (ref 0–35)
Alkaline Phosphatase: 56 U/L (ref 39–117)
CO2: 30 mEq/L (ref 19–32)
Calcium: 9.5 mg/dL (ref 8.4–10.5)
GFR calc non Af Amer: >60 mL/min (ref >60)
Glucose, Bld: 92 mg/dL (ref 70–99)
Potassium: 4 mEq/L (ref 3.5–5.1)
Sodium: 141 mEq/L (ref 135–145)

## 2011-02-10 LAB — CBC
HCT: 25.7 % — ABNORMAL LOW (ref 36.0–46.0)
HCT: 33.1 % — ABNORMAL LOW (ref 36.0–46.0)
HCT: 33.3 % — ABNORMAL LOW (ref 36.0–46.0)
Hemoglobin: 11.2 g/dL — ABNORMAL LOW (ref 12.0–15.0)
Hemoglobin: 11.3 g/dL — ABNORMAL LOW (ref 12.0–15.0)
Hemoglobin: 11.6 g/dL — ABNORMAL LOW (ref 12.0–15.0)
Hemoglobin: 8.6 g/dL — ABNORMAL LOW (ref 12.0–15.0)
Hemoglobin: 9 g/dL — ABNORMAL LOW (ref 12.0–15.0)
MCHC: 33.5 g/dL (ref 30.0–36.0)
MCHC: 33.5 g/dL (ref 30.0–36.0)
MCV: 86.6 fL (ref 78.0–100.0)
MCV: 87.6 fL (ref 78.0–100.0)
Platelets: 182 10*3/uL (ref 150–400)
Platelets: 201 10*3/uL (ref 150–400)
RBC: 2.99 MIL/uL — ABNORMAL LOW (ref 3.87–5.11)
RBC: 3.81 MIL/uL — ABNORMAL LOW (ref 3.87–5.11)
RBC: 4.04 MIL/uL (ref 3.87–5.11)
RDW: 16.1 % — ABNORMAL HIGH (ref 11.5–15.5)
RDW: 16.5 % — ABNORMAL HIGH (ref 11.5–15.5)
RDW: 16.9 % — ABNORMAL HIGH (ref 11.5–15.5)
WBC: 8.3 10*3/uL (ref 4.0–10.5)

## 2011-02-10 LAB — DIFFERENTIAL
Basophils Relative: 1 % (ref 0–1)
Eosinophils Absolute: 0.1 10*3/uL (ref 0.0–0.7)
Neutrophils Relative %: 63 % (ref 43–77)

## 2011-02-10 LAB — CROSSMATCH: ABO/RH(D): A POS

## 2011-02-10 LAB — PROTIME-INR
INR: 1.01 (ref 0.00–1.49)
Prothrombin Time: 13.2 seconds (ref 11.6–15.2)

## 2011-02-10 LAB — URINE CULTURE

## 2011-02-10 LAB — URINALYSIS, ROUTINE W REFLEX MICROSCOPIC
Bilirubin Urine: NEGATIVE
Hgb urine dipstick: NEGATIVE
Protein, ur: NEGATIVE mg/dL
Urobilinogen, UA: 0.2 mg/dL (ref 0.0–1.0)

## 2011-02-13 LAB — BASIC METABOLIC PANEL
BUN: 7 mg/dL (ref 6–23)
BUN: 8 mg/dL (ref 6–23)
Chloride: 102 mEq/L (ref 96–112)
Chloride: 102 mEq/L (ref 96–112)
Creatinine, Ser: 0.46 mg/dL (ref 0.4–1.2)
Creatinine, Ser: 0.55 mg/dL (ref 0.4–1.2)
GFR calc Af Amer: >60 mL/min (ref >60)
GFR calc non Af Amer: >60 mL/min (ref >60)
GFR calc non Af Amer: >60 mL/min (ref >60)
GFR calc non Af Amer: >60 mL/min (ref >60)
Glucose, Bld: 130 mg/dL — ABNORMAL HIGH (ref 70–99)
Glucose, Bld: 143 mg/dL — ABNORMAL HIGH (ref 70–99)
Potassium: 3.1 mEq/L — ABNORMAL LOW (ref 3.5–5.1)
Potassium: 3.8 mEq/L (ref 3.5–5.1)
Sodium: 136 mEq/L (ref 135–145)

## 2011-02-13 LAB — ABO/RH: ABO/RH(D): A POS

## 2011-02-13 LAB — URINALYSIS, ROUTINE W REFLEX MICROSCOPIC
Hgb urine dipstick: NEGATIVE
Protein, ur: NEGATIVE mg/dL
Specific Gravity, Urine: 1.016 (ref 1.005–1.030)
Urobilinogen, UA: 0.2 mg/dL (ref 0.0–1.0)

## 2011-02-13 LAB — DIFFERENTIAL
Eosinophils Absolute: 0.1 10*3/uL (ref 0.0–0.7)
Eosinophils Relative: 2 % (ref 0–5)
Lymphocytes Relative: 27 % (ref 12–46)
Lymphs Abs: 1.8 10*3/uL (ref 0.7–4.0)
Monocytes Relative: 7 % (ref 3–12)
Neutrophils Relative %: 64 % (ref 43–77)

## 2011-02-13 LAB — COMPREHENSIVE METABOLIC PANEL
ALT: 18 U/L (ref 0–35)
AST: 19 U/L (ref 0–37)
Calcium: 9.9 mg/dL (ref 8.4–10.5)
Creatinine, Ser: 0.55 mg/dL (ref 0.4–1.2)
GFR calc Af Amer: >60 mL/min (ref >60)
GFR calc non Af Amer: >60 mL/min (ref >60)
Sodium: 140 mEq/L (ref 135–145)
Total Protein: 7.2 g/dL (ref 6.0–8.3)

## 2011-02-13 LAB — CROSSMATCH
ABO/RH(D): A POS
Antibody Screen: NEGATIVE

## 2011-02-13 LAB — CBC
HCT: 27.7 % — ABNORMAL LOW (ref 36.0–46.0)
HCT: 29.8 % — ABNORMAL LOW (ref 36.0–46.0)
Hemoglobin: 10.1 g/dL — ABNORMAL LOW (ref 12.0–15.0)
MCHC: 34.3 g/dL (ref 30.0–36.0)
MCV: 91 fL (ref 78.0–100.0)
MCV: 91.8 fL (ref 78.0–100.0)
MCV: 92.4 fL (ref 78.0–100.0)
Platelets: 186 10*3/uL (ref 150–400)
Platelets: 214 10*3/uL (ref 150–400)
RDW: 15.8 % — ABNORMAL HIGH (ref 11.5–15.5)
RDW: 15.8 % — ABNORMAL HIGH (ref 11.5–15.5)
RDW: 16.2 % — ABNORMAL HIGH (ref 11.5–15.5)
WBC: 10.2 10*3/uL (ref 4.0–10.5)

## 2011-02-13 LAB — URINE CULTURE: Culture: NO GROWTH

## 2011-02-13 LAB — URINE MICROSCOPIC-ADD ON

## 2011-02-13 LAB — PROTIME-INR
INR: 1 (ref 0.00–1.49)
Prothrombin Time: 13 seconds (ref 11.6–15.2)

## 2011-03-22 ENCOUNTER — Ambulatory Visit: Payer: Self-pay | Admitting: Rehabilitative and Restorative Service Providers"

## 2011-03-23 NOTE — Assessment & Plan Note (Signed)
OFFICE VISIT   Brandi [name], Brandi [name]  DOB:  Dec 01, 1932                                       12/30/2008  CHART#:[id number]   REASON FOR VISIT:  Evaluate left leg swelling.   HISTORY:  This is a 77 year old female I am seeing at the request of Dr.  Lestine Box for evaluation of left foot swelling.  The patient states that  she began having difficulty at the end of January.  She has been seeing  Dr. Lestine Box who has performed a right bunionectomy.  He had placed her  in some mild compression stockings.  The patient does not complain of  any pain with this.  There are no aggravating or relieving factors.  She  is just alarmed at how swollen her foot has become.  It does not affect  anything past the midfoot.   REVIEW OF SYSTEMS:  GENERAL:  Negative for fevers, chills, weight gain,  weight loss.  CARDIAC:  Negative.  PULMONARY:  Positive for asthma.  GI:  Negative.  GU:  Negative.  VASCULAR:  There is swelling in her left foot.  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  ORTHO:  Positive arthritis and joint pain in her knees and tingling and  numbness in her fingers.  PSYCH:  Negative.  ENT:  Negative.  HEME:  Negative.   PAST MEDICAL HISTORY:  Asthma.   PAST SURGICAL HISTORY:  Right foot bunionectomy.   FAMILY HISTORY:  Negative for cardiovascular disease at a young age.   SOCIAL HISTORY:  She is married with 3 children.  She is retired.  Does  not smoke, has never smoked.  Does not drink alcohol.   MEDICATIONS:  Generic Zoloft, Evista, Singulair, Advair, Vitamin D,  Cipro.   ALLERGIES:  Extreme allergy to codeine and its derivatives including  Vicodin.   PHYSICAL EXAMINATION:  Vital Signs:  Blood pressure is 137/79, pulse 93.  General:  She is well-appearing, in no distress.  Cardiovascular:  Regular rate and rhythm.  HEENT:  Normocephalic,  atraumatic.  Respirations nonlabored.  Abdomen:  Soft, nontender.  Neuro:  Nonfocal.  Psych:  She is alert and x3.  Extremities:  She has palpable dorsalis  pedis and posterior tibial pulses bilaterally.  The left forefoot is  markedly edematous, there is 3+ pitting edema beginning at the midfoot.  There is minimal varicosities, no swelling above the ankle.   DIAGNOSTIC STUDIES:  Venous duplex was performed today which reveals no  evidence of deep venous thrombosis or superficial venous thrombosis in  the left leg.  The left great saphenous is competent.  Approximately 10  cm proximal to the ankle there is a large incompetent perforator to the  posterior tibial vein.   ASSESSMENT/PLAN:  Left leg swelling with incompetent perforator.   Plan:  We will try the patient in compression stockings.  I have given  her a prescription for 20-30 mmHg compression.  I think she would be a  candidate for percutaneous intervention on her incompetent perforator.  I will discuss this with Dr. Arbie Cookey and have the patient come back to see  him in 3 months.   Jorge Ny, MD  Electronically Signed   VWB/MEDQ  D:  12/30/2008  T:  12/31/2008  Job:  [id number]   cc:   Larina Earthly, M.D.  Leonides Grills, M.D.

## 2011-03-23 NOTE — Op Note (Signed)
NAME:  Brandi [name], Brandi [name] NO.:  [id number]   MEDICAL RECORD NO.:  [id number]          PATIENT TYPE:  AMB   LOCATION:  [facility]                          FACILITY:  [facility]   PHYSICIAN:  Leonides Grills, M.D.     DATE OF BIRTH:  18-Aug-1933   DATE OF PROCEDURE:  10/24/2007  DATE OF DISCHARGE:                               OPERATIVE REPORT   PREOPERATIVE DIAGNOSES:  1. Right hallux valgus.  2. Right second metatarsalgia.  3. Right second hammertoe.   POSTOPERATIVE DIAGNOSES:  1. Right hallux valgus.  2. Right second metatarsalgia.  3. Right second hammertoe.   OPERATIONS:  1. Right Chevron bunionectomy.  2. Stress x-rays right foot.  3. Right second Weil metatarsal shortening osteotomy.  4. Right second toe MTP joint dorsal capsulotomy collateral release.  5. Right second toe proximal phalanx head resection.  6. Right second toe FDL to proximal phalanx tendon transfer.  7. Right second toe EDB to EDL tendon transfer.   ANESTHESIA:  General.   SURGEON:  Leonides Grills, M.D.   ASSISTANT:  Evlyn Kanner, P.A.-C.   ESTIMATED BLOOD LOSS:  Minimal.   TOURNIQUET TIME:  Approximately an hour.   COMPLICATIONS:  None.   DISPOSITION:  Stable to the PR.   INDICATIONS:  This is a 77 year old very pleasant female who has had  persistent forefoot pain due to the above deformity and failed  conservative management which included wider shoes and extra-depth  shoes.  She was consented for the above procedure.  All risks, which  include infection, nerve or vessel injury, nonunion, malunion, hardware  tissue, hardware failure, persistent pain, worse pain, prolonged  recovery, stiffness, arthritis, recurrence of deformity, hallux varus,  and cock-up toe deformity were all explained.  Questions were encouraged  and answered.   OPERATIVE NOTE:  The patient was brought to the operating room and  placed in the supine position.  After adequate general endotracheal tube  anesthesia was  administered, as well as Ancef 1 g IV piggyback, the  right lower extremity was then prepped and draped in a sterile manner  over a proximally placed thigh tourniquet.  The limb was gravity saline.  The tourniquet was elevated to 290 mmHg.  A longitudinal incision was  made in line over the medial aspect of the right great toe MTP joint was  then made.  Dissection was carried down through the skin.  Hemostasis  was obtained.  Neurovascular structures were identified both superiorly  and inferiorly and protected throughout the case.  An L-shaped  capsulotomy was then made.  A bunionectomy was then performed with a  sagittal saw.  Alona Bene ridge was then rounded off with a rongeur.  The lateral capsule was then released with a curved Beaver blade.  This  had an excellent release of tight capsule.  The center of the head was  then identified, and a chevron osteotomy was then created with a  sagittal saw.  The head was then translated approximately 3-4 mm  laterally and then fixed with a 2 mm fully threaded cortical set screw  using a 1.5 mm  drill hole, respectively.  This had excellent purchase  and maintenance of the correction.  Redundant bone medially was trimmed  off with the sagittal saw.  Joint and area were copiously irrigated with  normal saline.  Capsule was advanced both superior and proximally and  reconstructed with 2-0 Vicryl stitch.  This had an Conservation officer, historic buildings.  Stress x-rays were obtained in the AP lateral planes that showed no  gross motion across the osteotomy site.  Fixation proposition and  excellent alignment as well.  Range of motion of the toe was excellent  as well.  We then made a longitudinal incision in the dorsal aspect of  the right second toe.  Dissection was carried down through skin.  Hemostasis was obtained.  EDB and EDL tendons were identified.  EDL  tendon was tenotomized proximal medial and brevis distal lateral and  retracted out of harm's way for  later transfer.  Dorsal capsulotomy with  collateral release was then performed with a 15 blade scalpel,  protecting soft tissues both medially and laterally.  The distal aspect  of the proximal phalanx was then skeletonized and carefully dissected  out, and the head was then removed with a rongeur, followed by a bone  cutter.  This cut was made perpendicular to the long axis of the  proximal phalanx.  A longitudinal incision was made in the plantar  plate, and the FDL tendon was then identified.  This was tenotomized as  distal as possible and left in the wound for later transfer.  We then  plantar flexed the proximal phalanx completely, and then with a stacked  sagittal saw blade performed a Weil metatarsal shortening osteotomy.  The head was then translated and shortened approximately 3 mm and then  fixed with a 1.5 mm fully threaded cortical set screw using a 1.1 mm  drill hole, respectively.  This was 12 mm in length.  This had excellent  purchase and maintenance of the correction.  Redundant bone ledge  dorsally was then trimmed off with a rongeur.  Bone wax was applied to  all exposed bony surfaces.  We then went back to the PIP joint, and then  with the FPL tendon prepared we drilled 2.5 and 3.5 mm drill holes in  the base of the proximal phalanx.  Then, with 3-0 PDS stitch, the FDL  tendon was then pulled through the drill from plantar to dorsal.  A  0.062 K-wire was then fired antegrade through the middle and distal  phalanx, reducing the PIP joint, and then fired this retrograde, with  the toe held in reduced position, tension placed on the FDL tendon  through the drill hole, with the ankle in reduced dorsiflexion.  K-wire  was then fired across the MTP joint.  This had excellent purchase and  maintenance of the correction, and the area and joint were copiously  irrigated with normal saline.  We then reconstructed and re-created the  dorsal extension expansion unit using 3-0 PDS  stitch, and we transferred  the EDB to the EDL tendon using 3-0 PDS stitch and sewed this to the  stump of the FDL tendon.  This had an Conservation officer, historic buildings.  Tourniquet  was deflated.  Hemostasis was obtained.  Toe pinked up beautifully.  Final x-rays were obtained in AP and lateral planes and showed no gross  motion across the osteotomy site, fixation in the proper position and  excellent as well.  Shortening was excellent, and the K-wire was in the  proper position.  Skin-relieving incisions were made on either side of  the K-wire. K-wires then cut, capped, and the toe pinked up nicely, and  was no pulsatile bleeding.  Subcu was closed with 3-0 Vicryl.  Skin was  closed with 4-0 nylon.  Sterile dressing was applied.  Roger Mann  dressing was applied.  A hard sole shoe was applied.  The patient was  stable to the PR.      Leonides Grills, M.D.  Electronically Signed    PB/MEDQ  D:  10/24/2007  T:  10/25/2007  Job:  [id number]

## 2011-03-23 NOTE — Consult Note (Signed)
NEW PATIENT CONSULTATION   [name], Brandi L  DOB:  1933/08/16                                       04/02/2009  GMWNU#:[id number]   The patient presents today for continued followup of swelling in her  left foot.  She had seen Dr. Myra Gianotti in our office for evaluation of  this in February of 2010.  At that time she underwent venous duplex  which showed no evidence of DVT.  Her only abnormal findings was that of  a medial perforator in the mid calf.  She was placed in knee high  graduated compression stocking.  She has resolved the swelling.  She  reports that the stocking aggravated her left foot and caused some  protrusion of the medial first metatarsal.  This has resolved her  swelling.  She has never had any pain associated with the swelling in  her left foot.  She does have total knee replacement bilaterally by Dr.  Valentina Gu.  She has completely normal arterial pulses and should not have any  vascular issue that would preclude her from surgery or impede her  healing.   I instructed the patient to continue her current treatment.  I have also  explained that she should try to discontinue the stockings to see if she  has any resumption of swelling and that this may have been a self-  limited problem that has completely resolved without the stockings.  I  also explained that she might try a lesser grade of stockings that would  not pinch her toes.  She was comfortable with the discussion and will  see [place] again on an as-needed basis.   Larina Earthly, M.D.  Electronically Signed   TFE/MEDQ  D:  04/02/2009  T:  04/03/2009  Job:  [id number]   cc:   Stacie Acres. Cliffton Asters, M.D.  Leonides Grills, M.D.  Mila Homer. Sherlean Foot, M.D.

## 2011-03-23 NOTE — Discharge Summary (Signed)
NAME:  Brandi [name], Brandi [name] NO.:  [id number]   MEDICAL RECORD NO.:  [id number]          PATIENT TYPE:  INP   LOCATION:  5031                         FACILITY:  [facility]   PHYSICIAN:  Mila Homer. Sherlean Foot, M.D. DATE OF BIRTH:  1933-06-14   DATE OF ADMISSION:  06/16/2009  DATE OF DISCHARGE:  06/20/2009                               DISCHARGE SUMMARY   ADMISSION DIAGNOSIS:  Osteoarthritis of the left knee.   DISCHARGE DIAGNOSES:  1. Osteoarthritis of the left knee.  2. Status post left knee arthroplasty.  3. Acute blood loss anemia, status post surgery.  4. Vestibular balance issues, status post meningitis.   PROCEDURE:  Left total knee arthroplasty.   HISTORY:  The patient is a 77 year old female who presents with pain in  her left knee times several years.  The pain is constant, dull achy pain  that is interfering with activities of daily living.  Conservative  treatment has failed.  The risks and benefits were discussed with the  patient and the patient would like to proceed with a left TKA.   ALLERGIES:  1. CODEINE.  2. DARVOCET.   ADMISSION MEDICATIONS:  Upon admission to the hospital, the patient was  taking;  1. Evista 60 mg daily.  2. Singulair 10 mg daily.  3. Sertraline HCL 50 mg daily.  4. Advair inhaler twice a day.  5. Vitamin for women daily.  6. Ibuprofen as needed.   HOSPITAL COURSE:  This is a 77 year old female admitted on June 16, 2009, after appropriate laboratory studies were obtained preoperatively,  as well as Ancef on-call to the operating room.  She was taken to the OR  where she underwent a left TKA.  The patient tolerated the procedure  well and was taken to the PACU in stable condition.  She was started on  p.o. pain meds of Dilaudid, as well as Dilaudid IV as needed for severe  pain and Foley was placed in short stay.   Postop day #1, the patient's vital signs were stable.  The patient  denied chest pain, shortness of breath or calf  pain.  The patient was  started on Lovenox 30 mg subcu q.12 hours at 8:00 a.m.  Consults to PT,  OT and Care Management were made.  The patient was weightbearing as  tolerated and was in the CPM 0 to 90 degrees 6-8 hours per day.  Incentive spirometry was taught.  The patient was progressing very  slowly with physical therapy because of her previous vestibular issues.   Postop day #2, the patient continued to progress slowly with physical  therapy.  The dressing was changed.  Marcaine pump and her Hemovac were  discontinued.  The Foley was actually discontinued and then replaced  because of incontinence issues.  The patient was continued on p.o. pain  meds.  The patient continued to work with physical therapy, but  struggled a bit.  Postop day #3, the patient again was struggling with  physical therapy, so at this time we got an FL-2 form and sent her out  for SNF placement.  The patient  agreed with plan because vestibular  issues was making physical therapy very, very hard.  She was progressing  good with her knee, but the problem seemed to be her dizziness.  Postop  day #4, the patient continued with physical therapy very slowly and was  discharged to the nursing facility.   While at the hospital the patient's labs remained stable.  She did drop  on postop day #2 to 3.1 potassium, where she was given KCL potassium by  mouth and quickly rose to 4.0 the next a.m. and it has been stable ever  since.  All other values were within normal limits.  The patient was  anemic with her hemoglobin dropping to 9.4, and then stable to 9.5 on  postop day #3.  It will continue to rise.   DISCHARGE INSTRUCTIONS:  There are no restrictions to diet.  She is to  follow the instruction sheet for wound care.  Increase activity slowly.  May use a cane or walker.  She is weightbearing as tolerated.  No  lifting or driving for 6 weeks.  She is to go to the skilled nursing  facility.  Physical therapy, the  patient is to be in CPM 0 to 90 degrees  6-8 hours a day x2 weeks and is to wear thigh-high compression stockings  bilaterally x3 weeks.  She may shower in two days if no drainage.  She  should not submerge the wound in any water.   Upon discharge, the patient was given prescriptions for;  1. Lovenox 40 mg inject once subcu daily, last dose being June 30, 2009.  2. Robaxin 500 mg 1-2 tablets every 6-8 hours as needed for pain.  3. The patient is allergic to codeine, so we are going to try her on      __________50 mg 1-2 tablets every 4-6 hours as needed for pain.  If      this is not available, she also has a prescription for Dilaudid 2      mg 1 tablet every 4 hours as needed for pain, so she should get      either or.  She should not get both at the same time.   The patient will follow with Dr. Sherlean Foot on July 01, 2009.  She is to  call for appointment [phone].  The patient is discharged in improved  condition.     ______________________________  [name], PA-C    ______________________________  Mila Homer. Sherlean Foot, M.D.    MJ/MEDQ  D:  06/20/2009  T:  06/20/2009  Job:  [id number]

## 2011-03-23 NOTE — Procedures (Signed)
DUPLEX DEEP VENOUS EXAM - LOWER EXTREMITY   INDICATION:  Left foot edema, 1 month.   HISTORY:  Edema:  Left foot, 1 month.  Trauma/Surgery:  Right foot, [employer] bunionectomy in 10/2007.  Pain:  Patient complains of no pain in the left leg.  PE:  No.  Previous DVT:  Patient has a history of right leg superficial venous  thrombus 12 years ago.  Anticoagulants:  No.  Other:   DUPLEX EXAM:                CFV   SFV   PopV  PTV    GSV                R  L  R  L  R  L  R   L  R  L  Thrombosis    o  o     o     o      o     o  Spontaneous   +  +     +     +      +     +  Phasic        +  +     +     +      +     +  Augmentation  +  +     +     +      +     +  Compressible  +  +     +     +      +     +  Competent     +  +     +     +      +     +   Legend:  + - yes  o - no  p - partial  D - decreased   IMPRESSION:  1. No evidence of deep or superficial vein thrombus on the left leg.  2. No evidence of left leg baker's cyst.  3. No evidence of left greater saphenous vein incompetence.  4. No evidence of deep vein incompetence.  5. 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    _____________________________  V. Charlena Cross, MD   MC/MEDQ  D:  12/30/2008  T:  12/30/2008  Job:  [id number]

## 2011-03-23 NOTE — Op Note (Signed)
NAME:  Brandi [name], Brandi [name] NO.:  [id number]   MEDICAL RECORD NO.:  [id number]          PATIENT TYPE:  INP   LOCATION:  5031                         FACILITY:  [facility]   PHYSICIAN:  Mila Homer. Sherlean Foot, M.D. DATE OF BIRTH:  [birth year]   DATE OF PROCEDURE:  DATE OF DISCHARGE:                               OPERATIVE REPORT   SURGEON:  Mila Homer. Sherlean Foot, MD   ASSISTANT:  1. [name], PA-C  2. Skip Mayer, PA-C   ANESTHESIA:  General.   PREOPERATIVE DIAGNOSIS:  Left knee osteoarthritis.   POSTOPERATIVE DIAGNOSIS:  Left knee osteoarthritis.   PROCEDURE:  Left total knee arthroplasty.   INDICATIONS FOR PROCEDURE:  The patient is a 77 year old white female  with failure of conservative measures for osteoarthritis of the left  knee with an extreme valgus knee.  Informed consent was obtained.   DESCRIPTION OF PROCEDURE:  The patient was laid supine and administered  general anesthesia.  After Foley catheter was placed, the left knee was  then prepped and draped in the usual sterile fashion.  A midline  incision was made with a #10 blade after exsanguination of the leg and  elevation of the tourniquet to 350 mmHg.  I then used a clean blade to  perform synovectomy and elevated deep MCL of the tibia, but not going  down to the pes tendon since this was severe valgus knee.  I everted the  patella and measured 22 mm thick.  I reamed down 9 mm and drilled  through lug holes with the prosthetic trial in place and recreated a 22-  mm thickness.  Then, we went to 9 degrees flexion subluxing the patella  laterally.  It used extramedullary alignment system on tibia to make  perpendicular cut to the anatomic axis of the tibia.  I made a 5  degrees' posterior slope.  I then used the intramedullary system on the  femur in the distal femur placing the intramedullary guide on 4 degrees  since this was severe valgus knee.  I pinned the cutting block in place  and made the distal  femoral cut with sagittal saw.  I then marked out  the epicondylar axis and posterior condylar angle measured 5 degrees.  I  sized to size E, pinned to 5-degree external rotation hole.  I then  placed the formal cutting block to the distal femur and made the  anterior, posterior, and chamfer cuts with sagittal saw.  I removed  trial, removed the cut surfaces of bone with the cutting block.  Placed  a lamina spreader in the knee, was severely tied on the lateral side.  I  pie-crusted the entirety of the IT band and removed the popliteus  tendon.  This afforded excellent balance.  I then finished the tibia  with a size 4 tibial tray drilling keel, femur with a size E femur and  then trialed E femur, 4 tibia, 10 and 12 insertions.  We chose a 12  insert at full extension and flexion easily beyond 130, patella tracked  well.  I then removed the trial components and copiously  irrigated.  I  then cemented and components removing all excess cement allowing the  cement to harden in extension.  I placed Hemovac coming out  superolaterally deep to arthrotomy and pain catheter coming out  supermedial and superficial to the arthrotomy.  I let tourniquet down  and cement was hardened in 75 minutes.  I copiously irrigated and then  closed the arthrotomy with figure-of-eight #1 Vicryl sutures, the deep  soft tissues with buried 0 Vicryl suture, subcuticular 2-0 Vicryl  stitches, skin staples.  Dressed with Xeroform dressing sponges, sterile  Webril, and TED stocking.   COMPLICATIONS:  None.   DRAINS:  One Hemovac and one pain catheter.   ESTIMATED BLOOD LOSS:  300 mL.   TOURNIQUET TIME:  75 minutes.           ______________________________  Mila Homer Sherlean Foot, M.D.     SDL/MEDQ  D:  06/16/2009  T:  06/17/2009  Job:  [id number]

## 2011-03-26 NOTE — H&P (Signed)
NAME:  Brandi [name], Brandi [name] NO.:  [id number]   MEDICAL RECORD NO.:  [id number]                   PATIENT TYPE:  INP   LOCATION:  1828                                 FACILITY:  [facility]   PHYSICIAN:  Hermelinda Medicus, M.D.                DATE OF BIRTH:  01-25-1933   DATE OF ADMISSION:  08/14/2003  DATE OF DISCHARGE:                                HISTORY & PHYSICAL   HISTORY OF PRESENT ILLNESS:  This patient is a 77 year old female who has  had approximately six days of antibiotic therapy using Levaquin and  Augmentin for a severe left ear infection where she was having a tingling  pain originally in her external ear canal developing into more of an  extensive pain in the mastoid and then developing into a headache type pain  which was somewhat excruciating approximately 2-3 days ago.  She was seen on  two different occasions, had quite a severe otitis media appearance,  erythema of the tympanic membrane, and was changed from Levaquin to  Augmentin.  She continued to have difficulties, and was also then began to  have some vertigo and difficulty walking keeping her balance, and was seen  in my office late yesterday afternoon where, on examination, she had bright  red tympanic membrane on the left side with an, otherwise, normal ENT  examination.  Her throat and nose were clear.  The right ear looked  excellent.  She had somewhat of a pressure on the left side, and  considerable pain with the difficulty with the imbalance.  She had no facial  nerve abnormalities, and no cranial nerve abnormalities.  EOMs were within  normal limits.  She underwent a myringotomy under my care, draining some  clear fluid, and she, last evening when I contacted the family, was doing  much better.  However, this morning she developed a third nerve palsy with a  diplopia with a slight horizontal diplopia here and a gait disorder that  seems to be more of a problem.  Therefore, when I  checked on her this  morning we brought her to the emergency room immediately for further  observation and a MRI and MRA.   She had a complete evaluation immediately by Dr. Sharene Skeans, and it did show  positive diplopia with a gait disorder.  Her hearing is really quite within  normal limits as it was last night except she does have some conductive loss  secondary to fluid behind the tympanic membrane.  Her medications given last  night were Valtrex and prednisone.  The Valtrex was 1500 mg last night,  prednisone 5 mg take six tablets.   PHYSICAL EXAMINATION:  VITAL SIGNS:  Temperature is within normal limits.  HEENT:  The ear today looks much improved with considerable resolution of  erythema on the left and the right is within normal limits.  The nose and  throat are clear.  She does have the diplopia with a slight nystagmus as  seen by Dr. Sharene Skeans, and also the double vision.  She has a small  irritation on the superior pinna of her left ear also.  NECK:  She has no evidence of stiffness of her neck or other neurologic  meningitis type symptoms.  LUNGS:  Clear.  No rales, rhonchi, or wheezes.  CARDIOVASCULAR:  No murmurs, rubs, or gallops.  ABDOMEN:  Essentially unremarkable.  NEUROLOGICAL:  The neurologic examination was completely done by Dr. Billie Ruddy which is in his notes.   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  MRI was  done which we reviewed with the neuroradiologist and with Dr. Sharene Skeans, and  it appears to be quite within normal limits.    PLAN:  We are continuing her therapy.  Dr. Sharene Skeans is planning to do a LP,  and the plan is to do acyclovir 10 mg/kg q.8 h. and Rocephin 2 g q.24 h.                                                Hermelinda Medicus, M.D.    JC/MEDQ  D:  08/14/2003  T:   08/14/2003  Job:  [id number]   cc:   Deanna Artis. Sharene Skeans, M.D.  [address]  [address]  [place]  [place] [address]  Fax: [phone]   Meredith Staggers, M.D.  [address], [address]  [place]  [place] [address]  Fax: [phone]

## 2011-03-26 NOTE — Consult Note (Signed)
NAME:  Brandi [name], Brandi [name]                        ACCOUNT NO.:  [id number]   MEDICAL RECORD NO.:  [id number]                   PATIENT TYPE:  EMS   LOCATION:  MAJO                                 FACILITY:  [facility]   PHYSICIAN:  Deanna Artis. Sharene Skeans, M.D.           DATE OF BIRTH:  Jan 27, 1933   DATE OF CONSULTATION:  08/14/2003  DATE OF DISCHARGE:                                   CONSULTATION   CHIEF COMPLAINT:  Diplopia, gait disorder, otitis media.   HISTORY OF PRESENT CONDITION:  The patient is a 77 year old right-handed,  married Caucasian woman who had onset of symptoms about 10 days ago that  started as a dull ache deep inside her left face anterior to her ear.  The  pain gradually intensified and became sharper involving the ear.  The  patient was unable to sleep.  She sought care and a diagnosis of otitis  media was made.  She was given three doses of Levaquin.  When her symptoms  continued to intensify she was seen again and placed on Augmentin.  When she  continued to have symptoms, she requested consultation with Dr. Hermelinda Medicus who evaluated her and found evidence of vesicles in the left  external auditory canal and on the tympanic membrane as well as a small  crusted lesion up on the superior portion of the pinna of the left ear.  There was an inflamed tympanic membrane with fluid behind it.  Myringotomy  was performed with release of a small amount of fluid.  The patient was  given Valtrex orally and also oral Prednisone.  Symptomatically she felt  somewhat better today.  Nonetheless when she awakened this morning she had  diplopia and difficulty walking.  She felt that her hearing was diminished  in the left side.  She was brought to the hospital for assessment by Dr. Esaw Dace who asked me to see the patient to consider the possibility of a  brain stem encephalitis.   The patient had severe headache about two days ago that was migrainous in  nature.  She has had  no headache today, no stiff neck, confusion, fever or  other constitutional symptoms.  Review of systems is negative except as  noted above.   PAST MEDICAL HISTORY:  The patient was previously healthy, has not been in  the hospital since she had childbirth.  She is a gravida 4, para 3.   PAST SURGICAL HISTORY:  None.   CURRENT MEDICATIONS:  Valtrex and Prednisone.   ALLERGIES:  No drug allergies.   FAMILY HISTORY:  Noncontributory.   SOCIAL HISTORY:  The patient is married, she is retired.  She is a  nonsmoker.   PHYSICAL EXAMINATION:  VITAL SIGNS: On examination today blood pressure is  147/74, resting pulse 95, respirations 20, temperature 98.6.  HEENT: Shows a left otitis media with vesicals in the external auditory  canal and on the  tympanic membrane with a small amount of blood where the  myringotomy was done, the tympanic membrane is inflamed but not bulging.  There is a crusted area in the superior pinna, no lymph nodes, no  meningismus, no bruits were appreciated.  LUNGS: Clear to auscultation.  HEART: No murmurs.  Pulses normal.  ABDOMEN: Soft, bowel sounds normal.  EXTREMITIES: Unremarkable.  NEUROLOGIC EXAMINATION: Awake, alert, normal language, fund of knowledge and  memory.  Cranial nerves - round reactive pupils. Visual fields full to  double simultaneous stimuli. The patient has vast component nystagmus to the  right.  No complaints of vertigo.  She has skew diplopia.  However with red  lens test paradoxically the diplopia disappeared.  The degree of skew  Maintained the same despite visual excursion throughout all fields of gaze.  The patient does not have obvious weakness in her extraocular movements  despite her diplopia.  The patient is squinting with her left eye.  She has  midline tongue, air conduction greater than bone conduction.  Midline Weber  air conduction is greater in the right ear than the left ear.  Symmetric  facial strength.  Midline tongue and  uvula noted.  Motor examination -  normal strength, tone and mass.  Good fine motor movements, no pronator  drift.  Sensation intact to cold vibrations, stereognosis.  Cerebellar  examination - good finger-to-nose, rapid fine motor movements.  Gait - she  tends to list a bit to the left.  Gait is broad based, negative Romberg.  Deep tendon reflexes are normal.  The patient had bilateral flexor plantar  responses.   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  There are no other signs of  encephalitis at this point.   PLAN:  1. MRI of the brain without and with contrast.  2. Lumbar puncture.  3. Acyclovir 10 mg/kg q.8h.  4. Rocephin 2 grams q.24h.   I appreciate the opportunity to participate in her care and will continue to  follow along with you.                                                  Deanna Artis. Sharene Skeans, M.D.    [facility]  D:  08/14/2003  T:  08/14/2003  Job:  [id number]   cc:   Hermelinda Medicus, M.D.  [address]  [place] [address]  Fax: [phone]   [facility]

## 2011-03-26 NOTE — Assessment & Plan Note (Signed)
[facility]                             PULMONARY OFFICE NOTE   NAME:Brandi [name], Brandi [name]                     MRN:          [id number]  DATE:12/01/2006                            DOB:          1933-05-13    PROBLEM:  Asthma with cough.   HISTORY:  I last saw her over a year ago.  She saw the nurse  practitioner in November with an exacerbation of sinusitis and  bronchitis that apparently responded to a Z-Pak followed by a course of  Omnicef.  She now feels fine denying any congestion, discharge, or  cough.  She has had flu vaccine for this year and has had Pneumococcal  vaccine at least once.  She will check with her primary physician as to  how many times she has actually had it.   MEDICATIONS:  She continues allergy vaccine at 1 to 10 with no problems,  getting injections here.  Advair 250/50, Singular 10 mg, Evista 60 mg,  Zoloft 50 mg, vitamin D, p.r.n. use of Tylenol, ibuprofen, rescue  Albuterol.   DRUG INTOLERANCE:  CODEINE, SULFA, AND VICODIN.   OBJECTIVE:  VITAL SIGNS:  Weight 164 pounds, blood pressure 152/82,  pulse rate 75, room air saturation 97%.  GENERAL:  She looks very comfortable.  HEENT:  Eyes, nose, and throat are clear.  LUNGS:  Clear to P&A.  There is no hoarseness and no cough.  HEART:  Sounds are regular without murmur.   IMPRESSION:  Stable and doing well.   PLAN:  She will continue current therapy.  Schedule to return in one  year, earlier p.r.n.  Advair and Singular were refilled.     Clinton D. Maple Hudson, MD, Tonny Bollman, FACP  Electronically Signed    CDY/MedQ  DD: 12/03/2006  DT: 12/03/2006  Job #: [id number]   cc:   Stacie Acres. Cliffton Asters, M.D.

## 2011-03-30 ENCOUNTER — Encounter: Payer: Self-pay | Admitting: Internal Medicine

## 2011-03-30 NOTE — Op Note (Signed)
   NAME:  ELEANNA, THEILEN                        ACCOUNT NO.:  [id number]   MEDICAL RECORD NO.:  [id number]                   PATIENT TYPE:  INP   LOCATION:  1828                                 FACILITY:  [facility]   PHYSICIAN:  Deanna Artis. Sharene Skeans, M.D.           DATE OF BIRTH:  1933-07-17   DATE OF PROCEDURE:  08/14/2003  DATE OF DISCHARGE:                                 OPERATIVE REPORT   INDICATIONS:  Herpes oticus -- evaluate for herpes zoster or herpes simplex,  meningoencephalitis.   PROCEDURE:  After informed consent the patient was sterilely prepped and  draped.  Local anesthesia at the L3-L4 interspace with 1% Xylocaine.  On the  second pass 12 cc of clear colorless fluid was obtained atraumatically.  Opening pressure 170 mm of water.  Closing pressure 130 mm of water.  The  patient tolerated the procedure well.  Fluid will be sent for culture and  gram stain, glucose, protein, VRDL, cryptococcal antigen, cell count and  differential, and for herpes culture, herpes simplex by PCR, and varicella  zoster by PCR.  The patient tolerated this procedure.                                               Deanna Artis. Sharene Skeans, M.D.    [facility]  D:  08/14/2003  T:  08/14/2003  Job:  [id number]

## 2011-03-30 NOTE — Op Note (Signed)
NAME:  Brandi [name], Brandi [name]                        ACCOUNT NO.:  [id number]   MEDICAL RECORD NO.:  [id number]                   PATIENT TYPE:  AMB   LOCATION:  ENDO                                 FACILITY:  [facility]   PHYSICIAN:  Bernette Redbird, M.D.                DATE OF BIRTH:  March 03, 1933   DATE OF PROCEDURE:  07/06/2004  DATE OF DISCHARGE:                                 OPERATIVE REPORT   PROCEDURE:  Colonoscopy with biopsy.   INDICATIONS:  This is a 77 year old female for colon cancer screening  without worrisome risk factors or symptoms.   FINDINGS:  Diminutive colonic polyp.   PROCEDURE:  The nature, purpose, and risk of the procedure had been  discussed with the patient, who provided written consent.  Sedation, at her  request, was kept very light, and consisted of fentanyl 25 mcg and Versed 2  mg IV without arrhythmia or desaturation.  The Olympus adjustable tension  pediatric video colonoscope was advanced without too much difficulty to the  terminal ileum, and pull-back was then performed.  The quality of the prep  was excellent, and it was felt that all areas were well seen.   There was a diminutive 2-3 mm sessile polyp in the mid colon at 70 cm, as  measured during pull-back.  Removed by several cold biopsies.  No other  polyps were seen, and there was no evidence of cancer, colitis, or vascular  malformations.  I did not see any diverticulosis.  Retroflexion in the  rectum and reinspection in the rectum was unremarkable.  The patient  tolerated the procedure well, and there were no apparent complications.   IMPRESSION:  Small colon polyp, removed as described above (211.3).   PLAN:  Await pathology on the polyp, with further management to depend on  the histologic findings.                                               Bernette Redbird, M.D.    RB/MEDQ  D:  07/06/2004  T:  07/06/2004  Job:  [id number]   cc:   Stacie Acres. White, M.D.  [address]  [place]  [place] [address]  Fax: [phone]

## 2011-03-30 NOTE — Discharge Summary (Signed)
NAME:  Brandi [name], Brandi [name] NO.:  [id number]   MEDICAL RECORD NO.:  [id number]                   PATIENT TYPE:  INP   LOCATION:  5031                                 FACILITY:  [facility]   PHYSICIAN:  Hermelinda Medicus, M.D.                DATE OF BIRTH:  Oct 06, 1933   DATE OF ADMISSION:  08/14/2003  DATE OF DISCHARGE:  08/27/2003                                 DISCHARGE SUMMARY   This patient is a 76 year old female who entered my office approximately  having had six days of Augmentin and Levaquin for severe left ear infection.  She is having some tingling pain originally in her left ear, and she was  having more pain around her mastoid and was developing a headache, temporal,  and occipital pain for the past two to three days. She entered my office  with this syndrome. She was also having some difficulty walking without  help. Did have some vertigo. On her examination in her office, she had very  little findings except she did have the bullous type inflammation of the  tympanic membrane of the left side, and this was drained with a myringotomy  because it was under pressure, and clear fluid was removed from this. She  was continued on her Levaquin, but she was placed also on Valtrex as an  outpatient, large loading dose, and also a prednisone loading dose 5 mg x6  which represented 30 mg. She did not have any facial nerve problem. Had no  diplopia. Had no evidence of any rash over her ear or in her ear canal. Over  the next day, I called her to check, and she did have a slight amount of  diplopia. With that, we brought her into the hospital and felt that IV  antiviral therapy may be more effective. She had some slight horizontal  diplopia on examination, and she also was brought for an urgent MRI and MRA.  She also had an immediate complete evaluation by Dr. Ellison Carwin who  noted also the positive diplopia finding and also a gait disorder. Her  hearing was  within normal limits except for the conductive hearing loss, and  she had still no facial nerve problem or any other cranial nerve problems.   PHYSICAL EXAMINATION:  GENERAL:  Her physical examination at that time  showed temperature was within normal limits.  HEENT:  ENT examination showed the ear to be improved with considerable  resolution of the erythema of the left tympanic membrane, and the right  tympanic membrane is within normal limits. There is still some fluid behind  the tympanic membrane on the left, however. Nose and throat are clear. She  does have no evidence of any cloudy drainage down the back of the throat. No  evidence of any erythema of her larynx or pharynx. She, however, does have  the double vision and is indicating a third nerve  paresis.  NECK:  Free of any thyromegaly or cervical adenopathy or mass.  Her gait is still limited.  LUNGS:  Clear. No rales, rhonchi, or wheezes.  CARDIOVASCULAR:  No murmurs or gallops.  ABDOMEN:  Free of any organomegaly or tenderness or mass.  NEUROLOGICAL:  As per Dr. Ellison Carwin; the patient was seen by Dr.  Sharene Skeans.   The plan is to increase her acyclovir to 10 mg/kg q.8h. and also gave her  Rocephin 2 g every 24 hours. Lumbar puncture was done which did show  evidence of a meningitis, cerebellar and central.   ADMISSION DIAGNOSES:  1. 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 The MRA was reviewed and MRI was     reviewed. This showed subtle signs also of inflammation of the left     infratentorial fossa tentorium. There was no evidence of any abnormal     fluid collections. No suggestion of empyema. No abnormal enhancement     along the intracanicular portions of the left eighth cranial nerve.     Mucosa enhancement and thickening was noted in the left nasopharynx and     oropharynx and also Rosenmuller.  This was markedly improved on the     followup  MRI. Furthermore, there was left sided mastoid air cell     opacification with fullness in the region of the left torus tubarius. No     evidence of any large vessel occlusion nor was there a discrete aneurysm.     The patient was treated with this medication. The laboratory reveals a     normal white count. She did have an elevation of her RDW and her hemogram     at 14.9. Glucose was slightly elevated but an IV was running. CSF cell     count showed 26 WBCs and 136 white cells. The patient was continued also     with cultures pending. No organisms seen. Gram stain was also negative.     Herpes simplex virus negative and then positive detected herpes simplex     virus DNA detected by PCR. The patient was continued close observation.     After she was seen by Dr. Orvan Falconer, the Rocephin was discontinued. It was     felt that steroids would be of no help at this stage. She was continued,     however, on high doses of the acyclovir IV. Physical therapy was     instilled almost immediately when she came into the hospital, and she     responded very well to this. She did after admission develop a seventh     rib paresis and for a few days had a paresis, but it appeared not to be     total facial nerve paralysis. She had seventh and eighth nerve     dysfunction on the left but continued to improve in terms of her physical     therapy, and her diplopia also showed some improvement to be just a     vertical diplopia. She was discharged home on October 19 to continue     vestibular rehabilitation. Eye care was discussed. Her diagnosis of     Ramsey-Hunt herpes zoster oticus was improving, though she still had     seventh, eighth, three, and six involved and had left mastoid cloudiness     continuing; however, she has been followed in the office and done  extremely well. The facial nerve now has recovered, and she has quit good    function of her right facial. Her hearing also shows some sensory  neural     loss. On audiology evaluation, she showed slight conductive and sensory     neural loss, and her diplopia is also improving.   FINAL DIAGNOSES:  1. 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 She is continually improving.  2. Further history is that of tonsillectomy as a child.  3. Pneumonia several years ago.                                                Hermelinda Medicus, M.D.    JC/MEDQ  D:  09/18/2003  T:  09/18/2003  Job:  [id number]   cc:   Deanna Artis. Sharene Skeans, M.D.  [address]  [address]  [place]  [place] [address]  Fax: [phone]   Cliffton Asters, M.D.  [address] [place]  [place] [address]  Fax: [phone]   Meredith Staggers, M.D.  [address]  [place]  [place] [address]  Fax: [phone]

## 2011-03-30 NOTE — Assessment & Plan Note (Signed)
[facility]                               PULMONARY OFFICE NOTE   NAME:Brandi [name], Brandi [name]                     MRN:          [id number]  DATE:09/09/2006                            DOB:          [birth year]    HISTORY OF PRESENT ILLNESS:  This is a 77 year old white female patient of  Dr. Maple Hudson who has known history of cough variant asthma with allergic  component that presents for an acute office visit.  The patient complains of  a one-week history of productive cough with thick yellow sputum and nasal  congestion.  The patient is currently maintained on Advair 250/50 twice  daily, Singulair daily.  The patient denies any hemoptysis, chest pain,  orthopnea, PND, or leg swelling.   PAST MEDICAL HISTORY:  Reviewed.   CURRENT MEDICATIONS:  Reviewed.   PHYSICAL EXAMINATION:  GENERAL: The patient is a pleasant elderly female in  no acute distress.  VITAL SIGNS:  Temperature 99.7, blood pressure 110/64, O2 saturation 98% on  room air.  HEENT: Nasal mucosa is pink and moist.  Posterior pharynx is clear.  NECK:  Supple without adenopathy.  No JVD.  CHEST: Lung sounds reveal diminished breath sounds at the bases, otherwise  clear without any crackles or wheezes.  CARDIAC:  Regular rate.  ABDOMEN:  Soft.  EXTREMITIES: Warm without edema.   IMPRESSION AND PLAN:  Acute upper respiratory infection.  The patient will  begin Z-Pak x1.  Mucinex DM twice daily as needed for cough.  The patient  will return with Dr. Maple Hudson as scheduled or sooner if needed.      Rubye Oaks, NP  Electronically Signed      Clinton D. Maple Hudson, MD, Tonny Bollman, FACP  Electronically Signed   TP/MedQ  DD: 09/09/2006  DT: 09/10/2006  Job #: [id number]

## 2011-04-07 ENCOUNTER — Encounter: Payer: Self-pay | Admitting: Internal Medicine

## 2011-04-07 ENCOUNTER — Ambulatory Visit (INDEPENDENT_AMBULATORY_CARE_PROVIDER_SITE_OTHER)
Admission: RE | Admit: 2011-04-07 | Discharge: 2011-04-07 | Disposition: A | Discharge status: Home / Self Care | Payer: Medicare Other | Source: Ambulatory Visit | Attending: Internal Medicine | Admitting: Internal Medicine

## 2011-04-07 ENCOUNTER — Ambulatory Visit (INDEPENDENT_AMBULATORY_CARE_PROVIDER_SITE_OTHER): Payer: Medicare Other | Admitting: Internal Medicine

## 2011-04-07 VITALS — BP 132/80 | HR 103 | Ht 68.0 in | Wt 160.2 lb

## 2011-04-07 DIAGNOSIS — R05 Cough: Secondary | ICD-10-CM

## 2011-04-07 DIAGNOSIS — R059 Cough, unspecified: Secondary | ICD-10-CM

## 2011-04-07 DIAGNOSIS — G0491 Myelitis, unspecified: Secondary | ICD-10-CM

## 2011-04-07 DIAGNOSIS — G0490 Encephalitis and encephalomyelitis, unspecified: Secondary | ICD-10-CM

## 2011-04-07 DIAGNOSIS — J42 Unspecified chronic bronchitis: Secondary | ICD-10-CM

## 2011-04-07 MED ORDER — TRAMADOL HCL 50 MG PO TABS: 50.0000 mg | ORAL_TABLET | Freq: Three times a day (TID) | ORAL | Status: DC | PRN

## 2011-04-07 MED ORDER — GABAPENTIN 100 MG PO CAPS: ORAL_CAPSULE | ORAL | Status: DC

## 2011-04-07 NOTE — Progress Notes (Signed)
  Subjective:    Patient ID: Brandi [name], female    DOB: 1933-02-24, 77 y.o.   MRN: [id number]  HPI 04/07/11 83 yoF never smoker swith chronic cough, chronic asthmatic bronchitis, significant hx of varicella encephalitis 2004. Husband here. Last here December 04, 2010.  Family member, a PA-C sends a note asking if cough might be from the old nerve inflammation, since it became noticeable about then, and asks about a trial of neurontin.  PFT 03/28/09- wnl. CXR  had looked like COPD with some scarring.  There is question of whether she may have hydrocephalus and this is being evaluated through a neurologist in [place] for potential shunt.  She coughs all day, worst in the morning, but not productive. Failed tessalon and all inhalers. Not dyspneic. Some seasonal postnasal drip. Denies any relation to food or swallowing. Codeine and vicodin give nausea and vomiting.  Review of Systems Constitutional:   No weight loss, night sweats,  Fevers, chills, fatigue, lassitude. HEENT:   No headaches,  Difficulty swallowing,  Tooth/dental problems,  Sore throat,                No sneezing, itching, ear ache, nasal congestion, post nasal drip,   CV:  No chest pain,  Orthopnea, PND, swelling in lower extremities, anasarca, dizziness, palpitations  GI  No heartburn, indigestion, abdominal pain, nausea, vomiting, diarrhea, change in bowel habits, loss of appetite  Resp: No shortness of breath with exertion or at rest.  No excess mucus, ,  No coughing up of blood.  No change in color of mucus.  No wheezing.   Skin: no rash or lesions.  GU: no dysuria, change in color of urine, no urgency or frequency.  No flank pain.  MS:  No joint pain or swelling.  No decreased range of motion.  No back pain.  Psych:  No change in mood or affect. No depression or anxiety.  No memory loss.      Objective:   Physical Exam General- Alert, Oriented, Affect-appropriate, Distress- none acute  Skin- rash-none, lesions-  none, excoriation- none  Lymphadenopathy- none  Head- atraumatic  Eyes- Gross vision intact, PERRLA, conjunctivae clear secretions She turns to mostly use her right eye.  Ears- Hearing, canals, Tm- normal  Nose- Clear,No- Septal dev, mucus, polyps, erosion, perforation   Throat- Mallampati II , mucosa clear , drainage- none, tonsils- atrophic  Neck- flexible , trachea midline, no stridor , thyroid nl, carotid no bruit  Chest - symmetrical excursion , unlabored     Heart/CV- RRR , no murmur , no gallop  , no rub, nl s1 s2                     - JVD- none , edema- none, stasis changes- none, varices- none     Lung- very mild rattle,        wheeze- none,  , dullness-none, rub- none.        persistent dry cough     Chest wall-   Abd- tender-no, distended-no, bowel sounds-present, HSM- no  Br/ Gen/ Rectal- Not done, not indicated  Extrem- cyanosis- none, clubbing, none, atrophy- none, strength- nl  Neuro- grossly intact to observation        Assessment & Plan:

## 2011-04-07 NOTE — Patient Instructions (Addendum)
Script for Tramadol- near narcotic- try for cough  Script for neurontin for cough- ask the neurologist before trying this.   CXR- chronic cough

## 2011-04-07 NOTE — Assessment & Plan Note (Signed)
Chronic and persistent problem. We will try neuronitn, after reviewing [employer] listing on it, if her Neurologist agrees.  We will update CXR and will let her try a couple of tramadol for tolerance.

## 2011-04-29 NOTE — Progress Notes (Signed)
Quick Note:  Spoke with patient-she is scheduled for Surgery on 05-10-2011(has been cleared);requests that I call her son Park Breed at cell at [phone]. Also I spoke with Cy who is aware of due surgery and states its okay to wait on this CT chest order until after her surgery.    Spoke with Ned-he is aware of the results and also understands to call me after pt is healed from her surgery to get CT chest scheduled. ______

## 2011-05-03 ENCOUNTER — Telehealth: Payer: Self-pay | Admitting: Internal Medicine

## 2011-05-03 DIAGNOSIS — J849 Interstitial pulmonary disease, unspecified: Secondary | ICD-10-CM

## 2011-05-03 NOTE — Telephone Encounter (Signed)
This has been ordered 

## 2011-05-03 NOTE — Telephone Encounter (Signed)
Spoke to pt's daughter in law who is very concerned about the cxr results that were given to pt and the fact that it took so long to get the results, she would like to speak with dr young directly to get her questions answered for the pt.

## 2011-05-03 NOTE — Telephone Encounter (Signed)
I discussed with Brandi [name]- patient's daughter in law, who asks we contact through her since Mrs Inclan has mild dementia pending shunt surgery. I apologized for communication delay at our end. I have reviewed the CXR image and discussed.  There is still time to get the CT done, so we are going forward with that. Preop labs were just done and can be accessed for contrast clearance.   Please order CT chest with contrast- dx interstitial lung disease   Contact daughter in law Brandi [name] [phone]

## 2011-05-04 ENCOUNTER — Ambulatory Visit (INDEPENDENT_AMBULATORY_CARE_PROVIDER_SITE_OTHER)
Admission: RE | Admit: 2011-05-04 | Discharge: 2011-05-04 | Disposition: A | Discharge status: Home / Self Care | Payer: Medicare Other | Source: Ambulatory Visit | Attending: Cardiovascular Disease | Admitting: Cardiovascular Disease

## 2011-05-04 DIAGNOSIS — J84115 Respiratory bronchiolitis interstitial lung disease: Secondary | ICD-10-CM

## 2011-05-04 DIAGNOSIS — J849 Interstitial pulmonary disease, unspecified: Secondary | ICD-10-CM

## 2011-05-04 MED ORDER — IOHEXOL 300 MG/ML  SOLN
80.0000 mL | Freq: Once | INTRAMUSCULAR | Status: AC | PRN
  Administered 2011-05-04: 80 mL via INTRAVENOUS

## 2011-05-05 ENCOUNTER — Telehealth: Payer: Self-pay | Admitting: Internal Medicine

## 2011-05-05 NOTE — Telephone Encounter (Signed)
Called, spoke with Park Breed - pt's son.  He is requesting CT Chest results that were done on yesterday.  Also, states pt is having a brain shunt on Monday, July 2nd and would like to have the plan figured out for this.  Will forward message to CDY -- pls advise.  Thanks!

## 2011-05-06 NOTE — Telephone Encounter (Signed)
I personally reviewed th CT images- significant patchy peripheral interstitial infiltrates which may be COP/BOOP, atypical AFB or interstitial fibrosis as first impressions. I don't think it is emergent or unstable. I discussed this with her son Park Breed. He was concerned that while asleep for shunt procedure, we might want to get lung biopsy. i explained it would be better to keep things clean and separate- get her neurologic condition treated first. Then on f/u we can decide how long to follow radiologically vs bronchoscopy for outpatient biopsy. He was comfortable with this approach.

## 2011-05-20 ENCOUNTER — Ambulatory Visit: Payer: Medicare Other | Attending: Neurosurgery | Admitting: Rehabilitative and Restorative Service Providers"

## 2011-05-20 DIAGNOSIS — R5381 Other malaise: Secondary | ICD-10-CM | POA: Insufficient documentation

## 2011-05-20 DIAGNOSIS — R279 Unspecified lack of coordination: Secondary | ICD-10-CM | POA: Insufficient documentation

## 2011-05-20 DIAGNOSIS — IMO0001 Reserved for inherently not codable concepts without codable children: Secondary | ICD-10-CM | POA: Insufficient documentation

## 2011-05-21 ENCOUNTER — Ambulatory Visit: Payer: Medicare Other | Admitting: Physical Therapy

## 2011-05-24 ENCOUNTER — Ambulatory Visit: Payer: Medicare Other | Admitting: Physical Therapy

## 2011-05-25 ENCOUNTER — Encounter: Payer: Medicare Other | Admitting: Physical Therapy

## 2011-05-27 ENCOUNTER — Other Ambulatory Visit: Payer: Self-pay | Admitting: Internal Medicine

## 2011-05-28 ENCOUNTER — Ambulatory Visit: Payer: Medicare Other | Admitting: Physical Therapy

## 2011-05-31 ENCOUNTER — Ambulatory Visit: Payer: Medicare Other | Admitting: Rehabilitative and Restorative Service Providers"

## 2011-06-01 ENCOUNTER — Telehealth: Payer: Self-pay | Admitting: Internal Medicine

## 2011-06-01 MED ORDER — MONTELUKAST SODIUM 10 MG PO TABS: 10.0000 mg | ORAL_TABLET | Freq: Every day | ORAL | Status: DC

## 2011-06-01 NOTE — Telephone Encounter (Signed)
Spoke with pt and notified that I sent her rx for singulair to [facility]. Pt verbalized understanding.

## 2011-06-02 ENCOUNTER — Ambulatory Visit: Payer: Medicare Other | Admitting: Occupational Therapy

## 2011-06-02 ENCOUNTER — Encounter: Payer: Medicare Other | Admitting: Rehabilitative and Restorative Service Providers"

## 2011-06-03 ENCOUNTER — Ambulatory Visit: Payer: Medicare Other | Admitting: Rehabilitative and Restorative Service Providers"

## 2011-06-07 ENCOUNTER — Ambulatory Visit: Payer: Medicare Other | Admitting: Rehabilitative and Restorative Service Providers"

## 2011-06-09 ENCOUNTER — Ambulatory Visit: Payer: Medicare Other | Admitting: Occupational Therapy

## 2011-06-09 ENCOUNTER — Ambulatory Visit: Payer: Medicare Other | Attending: Neurosurgery | Admitting: Rehabilitative and Restorative Service Providers"

## 2011-06-09 DIAGNOSIS — R5381 Other malaise: Secondary | ICD-10-CM | POA: Insufficient documentation

## 2011-06-09 DIAGNOSIS — R279 Unspecified lack of coordination: Secondary | ICD-10-CM | POA: Insufficient documentation

## 2011-06-09 DIAGNOSIS — IMO0001 Reserved for inherently not codable concepts without codable children: Secondary | ICD-10-CM | POA: Insufficient documentation

## 2011-06-14 ENCOUNTER — Encounter: Payer: Medicare Other | Admitting: Rehabilitative and Restorative Service Providers"

## 2011-06-15 ENCOUNTER — Ambulatory Visit: Payer: Medicare Other | Admitting: Internal Medicine

## 2011-06-15 ENCOUNTER — Ambulatory Visit: Payer: Medicare Other | Admitting: Occupational Therapy

## 2011-06-15 ENCOUNTER — Ambulatory Visit (INDEPENDENT_AMBULATORY_CARE_PROVIDER_SITE_OTHER): Payer: Medicare Other | Admitting: Internal Medicine

## 2011-06-15 ENCOUNTER — Encounter: Payer: Self-pay | Admitting: Internal Medicine

## 2011-06-15 ENCOUNTER — Ambulatory Visit: Payer: Medicare Other | Admitting: Rehabilitative and Restorative Service Providers"

## 2011-06-15 VITALS — BP 120/74 | HR 85 | Ht 68.0 in | Wt 158.6 lb

## 2011-06-15 DIAGNOSIS — J84115 Respiratory bronchiolitis interstitial lung disease: Secondary | ICD-10-CM

## 2011-06-15 DIAGNOSIS — J849 Interstitial pulmonary disease, unspecified: Secondary | ICD-10-CM

## 2011-06-15 NOTE — Assessment & Plan Note (Signed)
Concerning for UIP. There is no hx of overt reflux or of PE. I explained that we don't yet know how this is going to act. Consider offering perfenadone trial,.We discussed and will repeat CT in 4 months. That will be long enough after shunt placement to consider PFT.

## 2011-06-15 NOTE — Progress Notes (Signed)
Subjective:    Patient ID: Brandi [name], female    DOB: 03-Dec-1932, 77 y.o.   MRN: [id number]  HPI    Review of Systems     Objective:   Physical Exam        Assessment & Plan:   Subjective:    Patient ID: Brandi [name], female    DOB: 1933-06-05, 77 y.o.   MRN: [id number]  HPI 04/07/11 71 yoF never smoker swith chronic cough, chronic asthmatic bronchitis, significant hx of varicella encephalitis 2004. Husband here. Last here December 04, 2010.  Family member, a PA-C sends a note asking if cough might be from the old nerve inflammation, since it became noticeable about then, and asks about a trial of neurontin.  PFT 03/28/09- wnl. CXR  had looked like COPD with some scarring.  There is question of whether she may have hydrocephalus and this is being evaluated through a neurologist in [place] for potential shunt.  She coughs all day, worst in the morning, but not productive. Failed tessalon and all inhalers. Not dyspneic. Some seasonal postnasal drip. Denies any relation to food or swallowing. Codeine and vicodin give nausea and vomiting.   06/15/11-  [name] never smoker with chronic cough, chronic asthmatic bronchitis, significant hx of varicella encephalitis 2004, Benign normal pressure hydrocephalus.        Husband here. V-P shunt placed in July for hydrocephalus.. Within 2 days her cough had basically stopped and her thinking is much better.  We discussed how to stop Advair and Singulair for observation. CT chest 05/03/11- diffuse patchy peripheral interstitial lung disease. I reviewed the images with them and discussed significance.  Review of Systems Constitutional:   No-   weight loss, night sweats, fevers, chills, fatigue, lassitude. HEENT:   No-  headaches, difficulty swallowing, tooth/dental problems, sore throat,       No-  sneezing, itching, ear ache, nasal congestion, post nasal drip,  CV:  No-   chest pain, orthopnea, PND, swelling in lower extremities,  anasarca, dizziness, palpitations Resp: No- acute  shortness of breath with exertion or at rest.              No-   productive cough,  No non-productive cough,  No-  coughing up of blood.              No-   change in color of mucus.  No- wheezing.   Skin: No-   rash or lesions. GI:  No-   heartburn, indigestion, abdominal pain, nausea, vomiting, diarrhea,                 change in bowel habits, loss of appetite GU: No-   dysuria, change in color of urine, no urgency or frequency.  No- flank pain. MS:  No-   joint pain or swelling.  No- decreased range of motion.  No- back pain. Neuro- no change in long term sequelae of her meningitis.  Psych:  No- change in mood or affect. No depression or anxiety.  No memory loss.       Objective:   Physical Exam General- Alert, Oriented, Affect-appropriate, Distress- none acute Skin- rash-none, lesions- none, excoriation- none Lymphadenopathy- none Head- atraumatic            Eyes- Gross vision intact, PERRLA, conjunctivae clear secretions            Ears- Hearing, canals normal in quiet room            Nose- Clear,  No-Septal dev, mucus, polyps, erosion, perforation             Throat- Mallampati II , mucosa clear , drainage- none, tonsils- atrophic Neck- flexible , trachea midline, no stridor , thyroid nl, carotid no bruit Chest - symmetrical excursion , unlabored           Heart/CV- RRR , no murmur , no gallop  , no rub, nl s1 s2                           - JVD- none , edema- none, stasis changes- none, varices- none           Lung- trace crackles in lower zones, wheeze- none, cough- none , dullness-none, rub- none           Chest wall-  Abd- tender-no, distended-no, bowel sounds-present, HSM- no Br/ Gen/ Rectal- Not done, not indicated Extrem- cyanosis- none, clubbing, none, atrophy- none, strength- nl Neuro- grossly intact to observation- except that she needs a walker and turns to face with better eye.             Assessment & Plan:

## 2011-06-15 NOTE — Patient Instructions (Signed)
It would be ok now to stop Advair and Singulair and wait to see how you do. If cough gets worse, you can restart them if you would like.  Order- CT chest, no contrast- to be done in 4 months, before next visit- dx Interstitial Lung Disease

## 2011-06-16 ENCOUNTER — Ambulatory Visit: Payer: Medicare Other | Admitting: Rehabilitative and Restorative Service Providers"

## 2011-06-16 ENCOUNTER — Ambulatory Visit: Payer: Medicare Other | Admitting: Occupational Therapy

## 2011-06-18 ENCOUNTER — Encounter: Payer: Medicare Other | Admitting: Rehabilitative and Restorative Service Providers"

## 2011-06-21 ENCOUNTER — Encounter: Payer: Medicare Other | Admitting: Rehabilitative and Restorative Service Providers"

## 2011-06-22 ENCOUNTER — Encounter: Payer: Medicare Other | Admitting: Rehabilitative and Restorative Service Providers"

## 2011-06-29 ENCOUNTER — Ambulatory Visit: Payer: Medicare Other | Admitting: Occupational Therapy

## 2011-06-29 ENCOUNTER — Ambulatory Visit: Payer: Medicare Other | Admitting: Rehabilitative and Restorative Service Providers"

## 2011-07-01 ENCOUNTER — Ambulatory Visit: Payer: Medicare Other | Admitting: Occupational Therapy

## 2011-07-01 ENCOUNTER — Ambulatory Visit: Payer: Medicare Other | Admitting: Rehabilitative and Restorative Service Providers"

## 2011-07-02 ENCOUNTER — Encounter: Payer: Medicare Other | Admitting: Rehabilitative and Restorative Service Providers"

## 2011-07-05 ENCOUNTER — Encounter: Payer: Medicare Other | Admitting: Occupational Therapy

## 2011-07-05 ENCOUNTER — Ambulatory Visit: Payer: Medicare Other | Admitting: Rehabilitative and Restorative Service Providers"

## 2011-07-06 ENCOUNTER — Encounter: Payer: Medicare Other | Admitting: Occupational Therapy

## 2011-07-06 ENCOUNTER — Ambulatory Visit: Payer: Medicare Other | Admitting: Rehabilitative and Restorative Service Providers"

## 2011-07-08 ENCOUNTER — Encounter: Payer: Medicare Other | Admitting: Rehabilitative and Restorative Service Providers"

## 2011-07-13 ENCOUNTER — Encounter: Payer: Medicare Other | Admitting: Occupational Therapy

## 2011-07-13 ENCOUNTER — Ambulatory Visit: Payer: Medicare Other | Attending: Neurosurgery | Admitting: Rehabilitative and Restorative Service Providers"

## 2011-07-13 DIAGNOSIS — R5381 Other malaise: Secondary | ICD-10-CM | POA: Insufficient documentation

## 2011-07-13 DIAGNOSIS — R279 Unspecified lack of coordination: Secondary | ICD-10-CM | POA: Insufficient documentation

## 2011-07-13 DIAGNOSIS — IMO0001 Reserved for inherently not codable concepts without codable children: Secondary | ICD-10-CM | POA: Insufficient documentation

## 2011-07-15 ENCOUNTER — Ambulatory Visit: Payer: Medicare Other | Admitting: Rehabilitative and Restorative Service Providers"

## 2011-07-15 ENCOUNTER — Encounter: Payer: Medicare Other | Admitting: Occupational Therapy

## 2011-07-16 ENCOUNTER — Encounter: Payer: Medicare Other | Admitting: Rehabilitative and Restorative Service Providers"

## 2011-07-19 ENCOUNTER — Ambulatory Visit: Payer: Medicare Other | Admitting: Rehabilitative and Restorative Service Providers"

## 2011-07-23 ENCOUNTER — Ambulatory Visit: Payer: Medicare Other | Admitting: Rehabilitative and Restorative Service Providers"

## 2011-07-27 ENCOUNTER — Ambulatory Visit: Payer: Medicare Other | Admitting: Rehabilitative and Restorative Service Providers"

## 2011-07-28 ENCOUNTER — Ambulatory Visit: Payer: Medicare Other | Admitting: Physical Therapy

## 2011-08-03 ENCOUNTER — Ambulatory Visit: Payer: Medicare Other | Admitting: Rehabilitative and Restorative Service Providers"

## 2011-08-05 ENCOUNTER — Ambulatory Visit: Payer: Medicare Other | Admitting: Rehabilitative and Restorative Service Providers"

## 2011-08-09 ENCOUNTER — Ambulatory Visit: Payer: Medicare Other | Attending: Neurosurgery | Admitting: Rehabilitative and Restorative Service Providers"

## 2011-08-09 DIAGNOSIS — R5381 Other malaise: Secondary | ICD-10-CM | POA: Insufficient documentation

## 2011-08-09 DIAGNOSIS — R279 Unspecified lack of coordination: Secondary | ICD-10-CM | POA: Insufficient documentation

## 2011-08-09 DIAGNOSIS — IMO0001 Reserved for inherently not codable concepts without codable children: Secondary | ICD-10-CM | POA: Insufficient documentation

## 2011-08-12 ENCOUNTER — Other Ambulatory Visit: Payer: Self-pay | Admitting: Internal Medicine

## 2011-08-12 ENCOUNTER — Ambulatory Visit: Payer: Medicare Other | Admitting: Rehabilitative and Restorative Service Providers"

## 2011-08-12 ENCOUNTER — Telehealth: Payer: Self-pay | Admitting: Internal Medicine

## 2011-08-12 NOTE — Telephone Encounter (Signed)
[email or web address]  

## 2011-08-13 LAB — POCT HEMOGLOBIN-HEMACUE: Operator id: 123881

## 2011-08-13 NOTE — Telephone Encounter (Signed)
[email or web address]  

## 2011-08-16 ENCOUNTER — Emergency Department (HOSPITAL_COMMUNITY): Payer: Medicare Other

## 2011-08-16 ENCOUNTER — Emergency Department (HOSPITAL_COMMUNITY)
Admission: EM | Admit: 2011-08-16 | Discharge: 2011-08-16 | Disposition: A | Discharge status: Home / Self Care | Payer: Medicare Other | Attending: Emergency Medicine | Admitting: Emergency Medicine

## 2011-08-16 DIAGNOSIS — G912 (Idiopathic) normal pressure hydrocephalus: Secondary | ICD-10-CM | POA: Insufficient documentation

## 2011-08-16 DIAGNOSIS — Z8661 Personal history of infections of the central nervous system: Secondary | ICD-10-CM | POA: Insufficient documentation

## 2011-08-16 DIAGNOSIS — Z982 Presence of cerebrospinal fluid drainage device: Secondary | ICD-10-CM | POA: Insufficient documentation

## 2011-08-16 DIAGNOSIS — R209 Unspecified disturbances of skin sensation: Secondary | ICD-10-CM | POA: Insufficient documentation

## 2011-08-16 DIAGNOSIS — R05 Cough: Secondary | ICD-10-CM | POA: Insufficient documentation

## 2011-08-16 DIAGNOSIS — R5381 Other malaise: Secondary | ICD-10-CM | POA: Insufficient documentation

## 2011-08-16 DIAGNOSIS — Z79899 Other long term (current) drug therapy: Secondary | ICD-10-CM | POA: Insufficient documentation

## 2011-08-16 DIAGNOSIS — Z96659 Presence of unspecified artificial knee joint: Secondary | ICD-10-CM | POA: Insufficient documentation

## 2011-08-16 DIAGNOSIS — R5383 Other fatigue: Secondary | ICD-10-CM | POA: Insufficient documentation

## 2011-08-16 DIAGNOSIS — R059 Cough, unspecified: Secondary | ICD-10-CM | POA: Insufficient documentation

## 2011-08-16 LAB — CBC
HCT: 37.7 % (ref 36.0–46.0)
Hemoglobin: 12.3 g/dL (ref 12.0–15.0)
MCH: 29.9 pg (ref 26.0–34.0)
MCHC: 32.6 g/dL (ref 30.0–36.0)
MCV: 91.7 fL (ref 78.0–100.0)
Platelets: 236 K/uL (ref 150–400)
RBC: 4.11 MIL/uL (ref 3.87–5.11)
RDW: 14.2 % (ref 11.5–15.5)
WBC: 8.3 K/uL (ref 4.0–10.5)

## 2011-08-16 LAB — URINALYSIS, ROUTINE W REFLEX MICROSCOPIC
Bilirubin Urine: NEGATIVE
Glucose, UA: NEGATIVE mg/dL
Hgb urine dipstick: NEGATIVE
Ketones, ur: NEGATIVE mg/dL
Leukocytes, UA: NEGATIVE
Nitrite: NEGATIVE
Protein, ur: NEGATIVE mg/dL
Specific Gravity, Urine: 1.011 (ref 1.005–1.030)
Urobilinogen, UA: 0.2 mg/dL (ref 0.0–1.0)
pH: 7 (ref 5.0–8.0)

## 2011-08-16 LAB — COMPREHENSIVE METABOLIC PANEL WITH GFR
ALT: 12 U/L (ref 0–35)
AST: 18 U/L (ref 0–37)
Albumin: 3.7 g/dL (ref 3.5–5.2)
Alkaline Phosphatase: 65 U/L (ref 39–117)
BUN: 16 mg/dL (ref 6–23)
CO2: 29 meq/L (ref 19–32)
Calcium: 9.5 mg/dL (ref 8.4–10.5)
Chloride: 101 meq/L (ref 96–112)
Creatinine, Ser: 0.73 mg/dL (ref 0.50–1.10)
GFR calc Af Amer: >90 mL/min
GFR calc non Af Amer: 81 mL/min — ABNORMAL LOW
Glucose, Bld: 96 mg/dL (ref 70–99)
Potassium: 4.1 meq/L (ref 3.5–5.1)
Sodium: 137 meq/L (ref 135–145)
Total Bilirubin: 0.3 mg/dL (ref 0.3–1.2)
Total Protein: 7.5 g/dL (ref 6.0–8.3)

## 2011-08-16 LAB — CK TOTAL AND CKMB (NOT AT ARMC)
CK, MB: 2.9 ng/mL (ref 0.3–4.0)
Total CK: 110 U/L (ref 7–177)

## 2011-08-16 LAB — DIFFERENTIAL
Basophils Absolute: 0 K/uL (ref 0.0–0.1)
Basophils Relative: 1 % (ref 0–1)
Eosinophils Absolute: 0.4 K/uL (ref 0.0–0.7)
Eosinophils Relative: 5 % (ref 0–5)
Lymphocytes Relative: 18 % (ref 12–46)
Lymphs Abs: 1.5 K/uL (ref 0.7–4.0)
Monocytes Absolute: 0.6 K/uL (ref 0.1–1.0)
Monocytes Relative: 8 % (ref 3–12)
Neutro Abs: 5.8 K/uL (ref 1.7–7.7)
Neutrophils Relative %: 69 % (ref 43–77)

## 2011-08-16 LAB — TROPONIN I: Troponin I: <0.3 ng/mL

## 2011-08-16 NOTE — Telephone Encounter (Signed)
[email or web address]  

## 2011-08-17 ENCOUNTER — Ambulatory Visit: Payer: Medicare Other | Admitting: Rehabilitative and Restorative Service Providers"

## 2011-08-17 LAB — URINE CULTURE: Colony Count: NO GROWTH

## 2011-08-18 NOTE — Telephone Encounter (Signed)
Advair rx sent to [facility] on 08/12/11 # 180 x 3  Called, spoke with pt.  She is aware rx has already been sent to [facility].  She verbalized understanding.  Nothing further needed at this time but will call back if anything further is needed.

## 2011-08-19 ENCOUNTER — Ambulatory Visit: Payer: Medicare Other | Admitting: Rehabilitative and Restorative Service Providers"

## 2011-08-31 ENCOUNTER — Ambulatory Visit: Payer: Medicare Other | Admitting: Rehabilitative and Restorative Service Providers"

## 2011-09-03 ENCOUNTER — Ambulatory Visit: Payer: Medicare Other | Admitting: Physical Therapy

## 2011-09-06 ENCOUNTER — Other Ambulatory Visit: Payer: Self-pay | Admitting: Family Medicine

## 2011-09-06 ENCOUNTER — Ambulatory Visit: Payer: Medicare Other | Admitting: Rehabilitative and Restorative Service Providers"

## 2011-09-06 ENCOUNTER — Ambulatory Visit
Admission: RE | Admit: 2011-09-06 | Discharge: 2011-09-06 | Disposition: A | Discharge status: Home / Self Care | Payer: Medicare Other | Source: Ambulatory Visit | Attending: Family Medicine | Admitting: Family Medicine

## 2011-09-06 DIAGNOSIS — M545 Low back pain: Secondary | ICD-10-CM

## 2011-09-08 ENCOUNTER — Ambulatory Visit: Payer: Medicare Other | Admitting: Rehabilitative and Restorative Service Providers"

## 2011-09-13 ENCOUNTER — Ambulatory Visit: Payer: Medicare Other | Admitting: Physical Therapy

## 2011-09-13 ENCOUNTER — Encounter: Payer: Medicare Other | Admitting: Rehabilitative and Restorative Service Providers"

## 2011-09-13 ENCOUNTER — Ambulatory Visit: Payer: Medicare Other | Attending: Neurosurgery | Admitting: Physical Therapy

## 2011-09-13 DIAGNOSIS — R5381 Other malaise: Secondary | ICD-10-CM | POA: Insufficient documentation

## 2011-09-13 DIAGNOSIS — R279 Unspecified lack of coordination: Secondary | ICD-10-CM | POA: Insufficient documentation

## 2011-09-13 DIAGNOSIS — IMO0001 Reserved for inherently not codable concepts without codable children: Secondary | ICD-10-CM | POA: Insufficient documentation

## 2011-09-15 ENCOUNTER — Encounter: Payer: Medicare Other | Admitting: Rehabilitative and Restorative Service Providers"

## 2011-09-28 ENCOUNTER — Ambulatory Visit: Payer: Medicare Other | Admitting: Rehabilitative and Restorative Service Providers"

## 2011-10-11 ENCOUNTER — Ambulatory Visit (INDEPENDENT_AMBULATORY_CARE_PROVIDER_SITE_OTHER)
Admission: RE | Admit: 2011-10-11 | Discharge: 2011-10-11 | Disposition: A | Discharge status: Home / Self Care | Payer: Medicare Other | Source: Ambulatory Visit | Attending: Internal Medicine | Admitting: Internal Medicine

## 2011-10-11 DIAGNOSIS — J849 Interstitial pulmonary disease, unspecified: Secondary | ICD-10-CM

## 2011-10-11 DIAGNOSIS — J84115 Respiratory bronchiolitis interstitial lung disease: Secondary | ICD-10-CM

## 2011-10-12 ENCOUNTER — Ambulatory Visit: Payer: Medicare Other | Attending: Neurosurgery | Admitting: Rehabilitative and Restorative Service Providers"

## 2011-10-12 DIAGNOSIS — IMO0001 Reserved for inherently not codable concepts without codable children: Secondary | ICD-10-CM | POA: Insufficient documentation

## 2011-10-12 DIAGNOSIS — R5381 Other malaise: Secondary | ICD-10-CM | POA: Insufficient documentation

## 2011-10-12 DIAGNOSIS — R279 Unspecified lack of coordination: Secondary | ICD-10-CM | POA: Insufficient documentation

## 2011-10-15 ENCOUNTER — Ambulatory Visit: Payer: Medicare Other | Admitting: Internal Medicine

## 2011-10-21 ENCOUNTER — Ambulatory Visit: Payer: Medicare Other | Admitting: Rehabilitative and Restorative Service Providers"

## 2011-10-27 ENCOUNTER — Ambulatory Visit: Payer: Medicare Other | Admitting: Rehabilitative and Restorative Service Providers"

## 2011-10-29 NOTE — Progress Notes (Signed)
Quick Note:  Pt aware of results. ______ 

## 2011-11-01 ENCOUNTER — Encounter: Payer: Self-pay | Admitting: Internal Medicine

## 2011-11-01 ENCOUNTER — Ambulatory Visit (INDEPENDENT_AMBULATORY_CARE_PROVIDER_SITE_OTHER): Payer: Medicare Other | Admitting: Internal Medicine

## 2011-11-01 VITALS — BP 122/74 | HR 87 | Ht 68.0 in | Wt 155.2 lb

## 2011-11-01 DIAGNOSIS — J849 Interstitial pulmonary disease, unspecified: Secondary | ICD-10-CM

## 2011-11-01 DIAGNOSIS — J84115 Respiratory bronchiolitis interstitial lung disease: Secondary | ICD-10-CM

## 2011-11-01 NOTE — Patient Instructions (Signed)
We will call when we have the result of your last Chest CT scan  Please call as needed

## 2011-11-01 NOTE — Progress Notes (Signed)
Patient ID: Brandi [name], female    DOB: 10-17-1933, 77 y.o.   MRN: [id number]  HPI 04/07/11 53 yoF never smoker swith chronic cough, chronic asthmatic bronchitis, significant hx of varicella encephalitis 2004. Husband here. Last here December 04, 2010.  Family member, a PA-C sends a note asking if cough might be from the old nerve inflammation, since it became noticeable about then, and asks about a trial of neurontin.  PFT 03/28/09- wnl. CXR  had looked like COPD with some scarring.  There is question of whether she may have hydrocephalus and this is being evaluated through a neurologist in [place] for potential shunt.  She coughs all day, worst in the morning, but not productive. Failed tessalon and all inhalers. Not dyspneic. Some seasonal postnasal drip. Denies any relation to food or swallowing. Codeine and vicodin give nausea and vomiting.   06/15/11-  [name] never smoker with chronic cough, chronic asthmatic bronchitis, significant hx of varicella encephalitis 2004, Benign normal pressure hydrocephalus.        Husband here. V-P shunt placed in July for hydrocephalus.. Within 2 days her cough had basically stopped and her thinking is much better.  We discussed how to stop Advair and Singulair for observation. CT chest 05/03/11- diffuse patchy peripheral interstitial lung disease. I reviewed the images with them and discussed significance.  11/01/11- [name] never smoker with chronic cough, chronic asthmatic bronchitis, significant hx of varicella encephalitis 2004, Benign normal pressure hydrocephalus/ VP Shunt, ILD Daughter-in-law here. Interstitial lung disease was documented by CT scan in June. Subsequently she had an acute bronchitis with cough leading to a followup chest CT scan December 3 which I do not find in our system. Vertebral compression fracture was identified, secondary to cough, in October. Since then she has done better. Cough is now "not bad", nonproductive or scant clear  phlegm in the morning. Short of breath only proportional to exertion. She stopped Advair. Avelox caused hallucination and confusion. She and family are pleased with her overall status now.  Review of Systems Constitutional:   No-   weight loss, night sweats, fevers, chills, fatigue, lassitude. HEENT:   No-  headaches, difficulty swallowing, tooth/dental problems, sore throat,       No-  sneezing, itching, ear ache, nasal congestion, post nasal drip,  CV:  No-   chest pain, orthopnea, PND, swelling in lower extremities, anasarca, dizziness, palpitations Resp: No- acute  shortness of breath with exertion or at rest.              No-   productive cough,  Mild non-productive cough,  No-  coughing up of blood.              No-   change in color of mucus.  No- wheezing.   Skin: No-   rash or lesions. GI:  No-   heartburn, indigestion, abdominal pain, nausea, vomiting, diarrhea,                 change in bowel habits, loss of appetite GU:  MS:  No-   joint pain or swelling.  No- decreased range of motion.  No- back pain. Neuro- no change in long term sequelae of her meningitis.  Psych:  No- change in mood or affect. No depression or anxiety.  No memory loss.     Objective:   Physical Exam General- Alert, Oriented, Affect-appropriate, Distress- none acute Skin- rash-none, lesions- none, excoriation- none Lymphadenopathy- none Head- atraumatic  Eyes- Looks mainly with her good eye- squints with left. conjunctivae clear secretions            Ears- Hearing, canals normal in quiet room            Nose- Clear, No-Septal dev, mucus, polyps, erosion, perforation             Throat- Mallampati II , mucosa clear , drainage- none, tonsils- atrophic Neck- flexible , trachea midline, no stridor , thyroid nl, carotid no bruit Chest - symmetrical excursion , unlabored           Heart/CV- RRR , no murmur , no gallop  , no rub, nl s1 s2                           - JVD- none , edema- none, stasis  changes- none, varices- none           Lung- Wet squeaks R>L, wheeze- none, cough- none , dullness-none, rub- none           Chest wall-  Abd- tender-no, distended-no, bowel sounds-present, HSM- no Br/ Gen/ Rectal- Not done, not indicated Extrem- cyanosis- none, clubbing, none, atrophy- none, strength- nl Neuro- grossly intact to observation- except that she needs a walker and turns to face with better eye.

## 2011-11-06 ENCOUNTER — Encounter: Payer: Self-pay | Admitting: Internal Medicine

## 2011-11-06 NOTE — Assessment & Plan Note (Signed)
She symptomatically improved. We discussed available intervention, including Daliresp, and thought best not to make changes.

## 2011-11-16 ENCOUNTER — Telehealth: Payer: Self-pay | Admitting: Internal Medicine

## 2011-11-16 NOTE — Telephone Encounter (Signed)
ATC NA wcb not able to leave VM wcb

## 2011-11-17 NOTE — Telephone Encounter (Signed)
Brandi [name] returned call.  She stated that the CT in June showed ILD and pt was to have a repeat in 6 months which was done by this office > CT was done and resulted by CDY on 12.3.12.  The last ov note and June and December CT's have been printed for CDY for review.

## 2011-11-17 NOTE — Telephone Encounter (Signed)
Looks like Brandi [name] gave the pt CT Chest results on 10/29/11.  LMTCB for [employer]

## 2011-11-17 NOTE — Telephone Encounter (Signed)
lmomtcb x1 

## 2011-11-17 NOTE — Telephone Encounter (Signed)
Pt's next ov is 6.24.13.  Is pt to wait until then to discuss or is sooner appt needed?  Per Florentina Addison will forward back to CDY for recs.  Dr Maple Hudson please advise, thanks.

## 2011-11-17 NOTE — Telephone Encounter (Signed)
Per CY-this is a kind of scarring pattern but a lot of things can look like this. Will discuss at OV.

## 2011-11-17 NOTE — Telephone Encounter (Signed)
She had told us she had a CT chest done, which would help with assessing any question about interstitial lung disease. We don't have a record in the system that she had actually had a CT chest. Unless she can tell us exactly when and where one was done, then I would like to ORDER CT chest without contrast, for dx of interstitial lung disease/ fibrosis

## 2011-11-17 NOTE — Telephone Encounter (Signed)
Maureen returned call.  She stated that she is aware of the CT results as well as patient but is requesting an explanation on what ILD is.  Dr Maple Hudson please advise, thanks.  Per 12.3.12 result note:  Result Notes     Notes Recorded by Reynaldo Minium, CMA on 10/29/2011 at 11:33 AM Pt aware of results. ------  Notes Recorded by Waymon Budge, MD on 10/11/2011 at 1:27 PM CT- interstitial lung disease, new compression fx in low back. We will discuss at next visit.

## 2011-11-22 NOTE — Telephone Encounter (Signed)
I returned the call and spoke to the patient. Explained nonspecific interstitial disease on CT chest, stable since June, to be watched over time. Also discussed the coronary calcification and the lumbar vertebral compression fx incidental findings. The CT report and office note have been sent to PCP Dr C. White.

## 2012-02-07 ENCOUNTER — Ambulatory Visit: Payer: Medicare Other | Attending: Neurosurgery | Admitting: Rehabilitative and Restorative Service Providers"

## 2012-02-07 DIAGNOSIS — IMO0001 Reserved for inherently not codable concepts without codable children: Secondary | ICD-10-CM | POA: Insufficient documentation

## 2012-02-07 DIAGNOSIS — R269 Unspecified abnormalities of gait and mobility: Secondary | ICD-10-CM | POA: Insufficient documentation

## 2012-02-14 ENCOUNTER — Ambulatory Visit: Payer: Medicare Other | Admitting: Rehabilitative and Restorative Service Providers"

## 2012-02-15 ENCOUNTER — Encounter: Payer: Medicare Other | Admitting: Rehabilitative and Restorative Service Providers"

## 2012-02-16 ENCOUNTER — Ambulatory Visit: Payer: Medicare Other | Admitting: Rehabilitative and Restorative Service Providers"

## 2012-02-21 ENCOUNTER — Ambulatory Visit: Payer: Medicare Other | Admitting: Rehabilitative and Restorative Service Providers"

## 2012-02-23 ENCOUNTER — Ambulatory Visit: Payer: Medicare Other | Admitting: Physical Therapy

## 2012-02-25 ENCOUNTER — Ambulatory Visit: Payer: Medicare Other | Admitting: Rehabilitative and Restorative Service Providers"

## 2012-02-28 ENCOUNTER — Ambulatory Visit: Payer: Medicare Other | Admitting: Rehabilitative and Restorative Service Providers"

## 2012-03-01 ENCOUNTER — Ambulatory Visit: Payer: Medicare Other | Admitting: Rehabilitative and Restorative Service Providers"

## 2012-03-06 ENCOUNTER — Ambulatory Visit: Payer: Medicare Other | Admitting: Rehabilitative and Restorative Service Providers"

## 2012-03-08 ENCOUNTER — Ambulatory Visit: Payer: Medicare Other | Attending: Neurosurgery | Admitting: Rehabilitative and Restorative Service Providers"

## 2012-03-08 DIAGNOSIS — IMO0001 Reserved for inherently not codable concepts without codable children: Secondary | ICD-10-CM | POA: Insufficient documentation

## 2012-03-08 DIAGNOSIS — R269 Unspecified abnormalities of gait and mobility: Secondary | ICD-10-CM | POA: Insufficient documentation

## 2012-03-27 ENCOUNTER — Ambulatory Visit: Payer: Medicare Other | Admitting: Rehabilitative and Restorative Service Providers"

## 2012-05-01 ENCOUNTER — Ambulatory Visit: Payer: Medicare Other | Admitting: Internal Medicine

## 2012-06-26 ENCOUNTER — Ambulatory Visit (INDEPENDENT_AMBULATORY_CARE_PROVIDER_SITE_OTHER): Payer: Medicare Other | Admitting: Internal Medicine

## 2012-06-26 ENCOUNTER — Encounter: Payer: Self-pay | Admitting: Internal Medicine

## 2012-06-26 ENCOUNTER — Ambulatory Visit (INDEPENDENT_AMBULATORY_CARE_PROVIDER_SITE_OTHER)
Admission: RE | Admit: 2012-06-26 | Discharge: 2012-06-26 | Disposition: A | Discharge status: Home / Self Care | Payer: Medicare Other | Source: Ambulatory Visit | Attending: Internal Medicine | Admitting: Internal Medicine

## 2012-06-26 VITALS — BP 128/82 | HR 100 | Ht 68.0 in | Wt 148.8 lb

## 2012-06-26 DIAGNOSIS — J849 Interstitial pulmonary disease, unspecified: Secondary | ICD-10-CM

## 2012-06-26 DIAGNOSIS — J8410 Pulmonary fibrosis, unspecified: Secondary | ICD-10-CM

## 2012-06-26 NOTE — Progress Notes (Signed)
Patient ID: Brandi [name], female    DOB: 14-Apr-1933, 78 y.o.   MRN: [id number]  HPI 04/07/11 65 yoF never smoker swith chronic cough, chronic asthmatic bronchitis, significant hx of varicella encephalitis 2004. Husband here. Last here December 04, 2010.  Family member, a PA-C sends a note asking if cough might be from the old nerve inflammation, since it became noticeable about then, and asks about a trial of neurontin.  PFT 03/28/09- wnl. CXR  had looked like COPD with some scarring.  There is question of whether she may have hydrocephalus and this is being evaluated through a neurologist in [place] for potential shunt.  She coughs all day, worst in the morning, but not productive. Failed tessalon and all inhalers. Not dyspneic. Some seasonal postnasal drip. Denies any relation to food or swallowing. Codeine and vicodin give nausea and vomiting.   06/15/11-  [name] never smoker with chronic cough, chronic asthmatic bronchitis, significant hx of varicella encephalitis 2004, Benign normal pressure hydrocephalus.        Husband here. V-P shunt placed in July for hydrocephalus.. Within 2 days her cough had basically stopped and her thinking is much better.  We discussed how to stop Advair and Singulair for observation. CT chest 05/03/11- diffuse patchy peripheral interstitial lung disease. I reviewed the images with them and discussed significance.  11/01/11- [name] never smoker with chronic cough, chronic asthmatic bronchitis, significant hx of varicella encephalitis 2004, Benign normal pressure hydrocephalus/ VP Shunt, ILD Daughter-in-law here. Interstitial lung disease was documented by CT scan in June. Subsequently she had an acute bronchitis with cough leading to a followup chest CT scan December 3 which I do not find in our system. Vertebral compression fracture was identified, secondary to cough, in October. Since then she has done better. Cough is now "not bad", nonproductive or scant clear  phlegm in the morning. Short of breath only proportional to exertion. She stopped Advair. Avelox caused hallucination and confusion. She and family are pleased with her overall status now.  06/26/12- 78 yoF never smoker with chronic cough, chronic asthmatic bronchitis, significant hx of varicella encephalitis 2004, Benign normal pressure hydrocephalus/ VP Shunt, ILD.        Husband here Chest feels comfortable. She does cough productively in the mornings for a while> Clear white mucus, and then again when first lying down. She says she coughs especially if nervous or upset. Mild DOE with more than usual exertion. Since last here she reports multiple CT scans, mostly of her head but including her chest, related to her VP shunt management by neurosurgery in [place]. She is not aware of aspiration events related to her surgery, or of reflux events now at home. CT chest 10/29/11 IMPRESSION:  1. Pulmonary parenchymal pattern of peribronchovascular and  subpleural densities, traction bronchiectasis/bronchiolectasis and  subpleural reticulation, without zonal predominance, consistent  with interstitial lung disease. Differential diagnosis includes  chronic hypersensitivity pneumonitis and chronic eosinophilic  pneumonia.  2. Coronary artery calcification  3. Upper lumbar vertebral body inferior endplate compression  fracture is new from 09/06/2011.  Original Report Authenticated By: Reyes Ivan, M.D.   Review of Systems-see HPI Constitutional:   No-   weight loss, night sweats, fevers, chills, fatigue, lassitude. HEENT:   No-  headaches, difficulty swallowing, tooth/dental problems, sore throat,       No-  sneezing, itching, ear ache, nasal congestion, post nasal drip,  CV:  No-   chest pain, orthopnea, PND, swelling in lower extremities,  anasarca, dizziness, palpitations Resp: + shortness of breath with exertion or at rest.              +   productive cough,  Mild non-productive cough,  No-   coughing up of blood.              No-   change in color of mucus.  No- wheezing.   Skin: No-   rash or lesions. GI:  No-   heartburn, indigestion, abdominal pain, nausea, vomiting, GU:  MS:  No-   joint pain or swelling. . Neuro- no change in long term sequelae of her meningitis.  Psych:  No- change in mood or affect. No depression or anxiety.  No memory loss.     Objective:   Physical Exam General- Alert, Oriented, Affect-appropriate, Distress- none acute, talkative and appropriate. Skin- rash-none, lesions- none, excoriation- none Lymphadenopathy- none Head- atraumatic            Eyes- Looks mainly with her good eye- squints with left. conjunctivae clear secretions            Ears- Hearing, canals normal in quiet room            Nose- Clear, No-Septal dev, mucus, polyps, erosion, perforation             Throat- Mallampati II , mucosa clear , drainage- none, tonsils- atrophic Neck- flexible , trachea midline, no stridor , thyroid nl, carotid no bruit. + Shunt right neck Chest - symmetrical excursion , unlabored           Heart/CV- RRR , no murmur , no gallop  , no rub, nl s1 s2                           - JVD- none , edema- none, stasis changes- none, varices- none           Lung- + light rhonchi right apex, wheeze- none, cough- she coughed for me> dry , dullness-none, rub- none           Chest wall-  Abd- tender-no, distended-no, bowel sounds-present, HSM- no Br/ Gen/ Rectal- Not done, not indicated Extrem- cyanosis- none, clubbing, none, atrophy- none, strength- nl Neuro- grossly intact to observation- except that she needs a walker and turns to face with better eye. L lid droop

## 2012-06-26 NOTE — Patient Instructions (Addendum)
Order CXR  Dx chronic interstitial lung disease  We will contact Dr Dannette Barbara office to see what chest radiology imaging he has done recently

## 2012-07-02 NOTE — Assessment & Plan Note (Signed)
Clinically she minimizes respiratory issues at this visit, suggesting she is better or at least stable. We reviewed her images. We don't want to overdo CT scans and she emphasizes that she has had several in [place]. Plan-chest x-ray today and request  any CT chest imaging data available from [facility]. Depending on pulmonary stability, now that her neurosurgical issues are more stable, need to decide on how to proceed with this. She denies suggestive systemic symptoms and she also is unaware of any potential reflux advanced that could create a chronic aspiration pneumonia.

## 2012-09-06 ENCOUNTER — Emergency Department (HOSPITAL_COMMUNITY): Payer: Medicare Other

## 2012-09-06 ENCOUNTER — Emergency Department (HOSPITAL_COMMUNITY)
Admission: EM | Admit: 2012-09-06 | Discharge: 2012-09-06 | Disposition: A | Discharge status: Home / Self Care | Payer: Medicare Other | Attending: Emergency Medicine | Admitting: Emergency Medicine

## 2012-09-06 DIAGNOSIS — Y939 Activity, unspecified: Secondary | ICD-10-CM | POA: Insufficient documentation

## 2012-09-06 DIAGNOSIS — J45909 Unspecified asthma, uncomplicated: Secondary | ICD-10-CM | POA: Insufficient documentation

## 2012-09-06 DIAGNOSIS — G912 (Idiopathic) normal pressure hydrocephalus: Secondary | ICD-10-CM | POA: Insufficient documentation

## 2012-09-06 DIAGNOSIS — S065XAA Traumatic subdural hemorrhage with loss of consciousness status unknown, initial encounter: Secondary | ICD-10-CM | POA: Insufficient documentation

## 2012-09-06 DIAGNOSIS — J8410 Pulmonary fibrosis, unspecified: Secondary | ICD-10-CM | POA: Insufficient documentation

## 2012-09-06 DIAGNOSIS — S065X9A Traumatic subdural hemorrhage with loss of consciousness of unspecified duration, initial encounter: Secondary | ICD-10-CM

## 2012-09-06 DIAGNOSIS — W1809XA Striking against other object with subsequent fall, initial encounter: Secondary | ICD-10-CM | POA: Insufficient documentation

## 2012-09-06 DIAGNOSIS — Z79899 Other long term (current) drug therapy: Secondary | ICD-10-CM | POA: Insufficient documentation

## 2012-09-06 DIAGNOSIS — Z982 Presence of cerebrospinal fluid drainage device: Secondary | ICD-10-CM | POA: Insufficient documentation

## 2012-09-06 DIAGNOSIS — Y929 Unspecified place or not applicable: Secondary | ICD-10-CM | POA: Insufficient documentation

## 2012-09-06 LAB — CBC
MCHC: 32.9 g/dL (ref 30.0–36.0)
Platelets: 245 10*3/uL (ref 150–400)
RDW: 14.2 % (ref 11.5–15.5)
WBC: 9.6 10*3/uL (ref 4.0–10.5)

## 2012-09-06 LAB — URINALYSIS, ROUTINE W REFLEX MICROSCOPIC
Bilirubin Urine: NEGATIVE
Nitrite: NEGATIVE
Specific Gravity, Urine: 1.018 (ref 1.005–1.030)
Urobilinogen, UA: 0.2 mg/dL (ref 0.0–1.0)

## 2012-09-06 LAB — BASIC METABOLIC PANEL
BUN: 15 mg/dL (ref 6–23)
Chloride: 97 mEq/L (ref 96–112)
GFR calc Af Amer: >90 mL/min (ref >90)
GFR calc non Af Amer: 85 mL/min — ABNORMAL LOW (ref >90)
Potassium: 3.6 mEq/L (ref 3.5–5.1)
Sodium: 132 mEq/L — ABNORMAL LOW (ref 135–145)

## 2012-09-06 LAB — URINE MICROSCOPIC-ADD ON

## 2012-09-06 MED ORDER — ONDANSETRON 4 MG PO TBDP: ORAL_TABLET | ORAL | Status: DC

## 2012-09-06 MED ORDER — TRAMADOL HCL 50 MG PO TABS: 50.0000 mg | ORAL_TABLET | Freq: Four times a day (QID) | ORAL | Status: DC | PRN

## 2012-09-06 NOTE — ED Provider Notes (Signed)
History     CSN: [id number]  Arrival date & time 09/06/12  [id number]   First MD Initiated Contact with Patient 09/06/12 [phone]      Chief Complaint  Patient presents with  . Headache    (Consider location/radiation/quality/duration/timing/severity/associated sxs/prior treatment) HPI  Brandi [name] is a 78 y.o. female complaining of 5/10 occipital headache with nausea and 3 episodes of nonbloody nonbilious vomiting this a.m. headache is not exacerbated by Valsalva however it is exacerbated by leaning forward. Patient had a fall 3 days ago, denies loss of consciousness, but endorses head trauma with ecchymosis to the left eye. 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 She has a shunt placement shunt was adjusted 3 weeks ago. Patient denies vertigo, endorses vestibular imbalance secondary to the ramsay hunt syndrome that is unchanged from her baseline. Denies taking any blood thinners/ASA.   Past Medical History  Diagnosis Date  . Asthma   . Shingles   . Herpes, meningitis, zoster     deaf in left ear  . Pulmonary fibrosis   . Normal pressure hydrocephalus     Past Surgical History  Procedure Date  . Bunionectomy   . Replacement total knee bilateral   . Ventriculoperitoneal shunt     Family History  Problem Relation Age of Onset  . Heart failure Mother   . Emphysema Mother   . Asthma Mother   . Arthritis Mother   . Cancer    . COPD      History  Substance Use Topics  . Smoking status: Never Smoker   . Smokeless tobacco: Never Used  . Alcohol Use: Not on file    OB History    Grav Para Term Preterm Abortions TAB SAB Ect Mult Living                  Review of Systems  Constitutional: Negative for fever.  Respiratory: Negative for shortness of breath.   Cardiovascular: Negative for chest pain.  Gastrointestinal: Positive for nausea and vomiting. Negative for abdominal pain and diarrhea.    Neurological: Positive for headaches.  All other systems reviewed and are negative.    Allergies  Avelox; Codeine; Hydrocodone-acetaminophen; and Sulfonamide derivatives  Home Medications   Current Outpatient Rx  Name Route Sig Dispense Refill  . BUPROPION HCL 100 MG PO TABS  Once a day     . DONEPEZIL HCL 10 MG PO TABS Oral Take 10 mg by mouth daily.      . ERGOCALCIFEROL 50000 UNITS PO CAPS Oral Take 50,000 Units by mouth once a week.      . IBUPROFEN 200 MG PO TABS  As needed     . IBUPROFEN 800 MG PO TABS  1 tablet every 4-6 hours as needed     . MONTELUKAST SODIUM 10 MG PO TABS Oral Take 1 tablet (10 mg total) by mouth daily. 90 tablet 3  . RALOXIFENE HCL 60 MG PO TABS Oral Take 60 mg by mouth daily.      . SERTRALINE HCL 100 MG PO TABS Oral Take 100 mg by mouth daily.        BP 158/88  Pulse 87  Temp 98.6 F (37 C) (Oral)  Resp 20  SpO2 98%  Physical Exam  Nursing note and vitals reviewed. Constitutional: She is oriented to person, place, and time. She appears well-developed and well-nourished. No distress.  HENT:  Head: Normocephalic.  Eyes: Conjunctivae normal  and EOM are normal.  Cardiovascular: Normal rate.   Pulmonary/Chest: Effort normal and breath sounds normal. No stridor. No respiratory distress. She has no wheezes. She has no rales. She exhibits no tenderness.  Abdominal: Soft. Bowel sounds are normal. She exhibits no distension and no mass. There is no tenderness. There is no rebound and no guarding.  Musculoskeletal: Normal range of motion.  Neurological: She is alert and oriented to person, place, and time.       Cranial nerves III through XII intact, strength 5 out of 5x4 extremities, negative pronator drift, finger to nose and heel-to-shin coordinated, sensation intact to pinprick and light touch,   gait is coordinated and Romberg is negative.   Psychiatric: She has a normal mood and affect.    ED Course  Procedures (including critical care  time)  Labs Reviewed  BASIC METABOLIC PANEL - Abnormal; Notable for the following:    Sodium 132 (*)     GFR calc non Af Amer 85 (*)     All other components within normal limits  URINALYSIS, ROUTINE W REFLEX MICROSCOPIC - Abnormal; Notable for the following:    Ketones, ur 15 (*)     Leukocytes, UA TRACE (*)     All other components within normal limits  URINE MICROSCOPIC-ADD ON - Abnormal; Notable for the following:    Squamous Epithelial / LPF FEW (*)     All other components within normal limits  CBC  URINE CULTURE   Ct Head Wo Contrast  09/06/2012  *RADIOLOGY REPORT*  Clinical Data: Fall, headache, nausea  CT HEAD WITHOUT CONTRAST  Technique:  Contiguous axial images were obtained from the base of the skull through the vertex without contrast.  Comparison: 08/16/2011  Findings: No skull fracture is noted.  Paranasal sinuses and mastoid air cells are unremarkable.  Right ventricular catheter with tip in the frontal horn is unchanged in position.  Ventricular size is stable from prior exam. No mass effect or midline shift.  In axial image 14 and 15 there is small amount of subdural hemorrhage in the right frontal/parietal lobe.  Measures about 3.5 mm thickness extending about 4 cm along the inner skull.  There is additional small amount of sulcal hemorrhage right anterior parietal lobe see axial image 20.  No acute cortical infarction.  No mass lesion is noted on this unenhanced scan.  IMPRESSION: Right ventricular catheter with tip in the frontal horn is unchanged in position.  Ventricular size is stable from prior exam. No mass effect or midline shift.  In axial image 14 and 15 there is small amount of subdural hemorrhage in the right frontal/parietal lobe.  Measures about 3.5 mm thickness extending about 4 cm along the inner skull.  There is additional small amount of sulcal hemorrhage right anterior parietal lobe see axial image 20.  Critical findings discussed with PA  Boss Danielsen   Original  Report Authenticated By: Natasha Mead, M.D.     Date: 09/06/2012  Rate: 89  Rhythm: normal sinus rhythm  QRS Axis: normal  Intervals: PR prolonged  ST/T Wave abnormalities: normal  Conduction Disutrbances:first-degree A-V block   Narrative Interpretation:   Old EKG Reviewed: unchanged   1. Subdural hematoma       MDM  Critical notification from Radiologist Dr. Ruffin Frederick Appreciated: there is a small subdural hematoma in the right anterior measuring 4 mm by approximately 4 cm there is no midline shift .   Consult from neurosurgeon Dr. Bettina Gavia appreciated:  Doubt shunt needs  to be reprogrammed, needs supportive care and to follow with her neurosurgeon in 4-7days for repeat CT. Will burn disc for her to take.   Patient passed by mouth challenge.   D/c delayed secondary to obtaining UA  Blood work and UA unremarkable there are trace leukocytes, I will culture.    Pt verbalized understanding and agrees with care plan. Outpatient follow-up and return precautions given.    New Prescriptions   ONDANSETRON (ZOFRAN ODT) 4 MG DISINTEGRATING TABLET    4mg  ODT q4 hours prn nausea/vomit   TRAMADOL (ULTRAM) 50 MG TABLET    Take 1 tablet (50 mg total) by mouth every 6 (six) hours as needed for pain.        Wynetta Emery, PA-C 09/06/12 [phone]

## 2012-09-06 NOTE — ED Provider Notes (Signed)
Medical screening examination/treatment/procedure(s) were performed by non-physician practitioner and as supervising physician I was immediately available for consultation/collaboration.  Damione Robideau R. Emerald Gehres, MD 09/06/12 1645 

## 2012-09-06 NOTE — ED Notes (Signed)
Pt returned from CT via stretcher at this time.

## 2012-09-06 NOTE — ED Notes (Signed)
Pt fell on Sunday hit her head on floor. This morning she is complaining of headache and nausea

## 2012-09-06 NOTE — ED Notes (Signed)
Provider at bedside

## 2012-09-06 NOTE — ED Notes (Signed)
Pt daughter requesting copies of todays records to take with them.  ED sec working with pt to sign release and obtain copies

## 2012-09-06 NOTE — ED Notes (Signed)
Drank coffee without problem. States her headache is easing at this time

## 2012-09-09 LAB — URINE CULTURE: Colony Count: 35000

## 2012-09-10 NOTE — ED Notes (Signed)
+   Urine Chart sent to EDP office for review. 

## 2012-09-11 NOTE — ED Notes (Signed)
Likely contaminant per Emily West PAC 

## 2012-11-30 ENCOUNTER — Ambulatory Visit
Admission: RE | Admit: 2012-11-30 | Discharge: 2012-11-30 | Disposition: A | Discharge status: Home / Self Care | Payer: Medicare Other | Source: Ambulatory Visit | Attending: Family Medicine | Admitting: Family Medicine

## 2012-11-30 ENCOUNTER — Other Ambulatory Visit: Payer: Self-pay | Admitting: Family Medicine

## 2012-11-30 DIAGNOSIS — J40 Bronchitis, not specified as acute or chronic: Secondary | ICD-10-CM

## 2012-12-18 ENCOUNTER — Other Ambulatory Visit (INDEPENDENT_AMBULATORY_CARE_PROVIDER_SITE_OTHER): Payer: Medicare Other

## 2012-12-18 ENCOUNTER — Encounter: Payer: Self-pay | Admitting: Internal Medicine

## 2012-12-18 ENCOUNTER — Ambulatory Visit (INDEPENDENT_AMBULATORY_CARE_PROVIDER_SITE_OTHER): Payer: Medicare Other | Admitting: Internal Medicine

## 2012-12-18 VITALS — BP 138/86 | HR 102 | Ht 68.0 in | Wt 136.2 lb

## 2012-12-18 DIAGNOSIS — J8410 Pulmonary fibrosis, unspecified: Secondary | ICD-10-CM

## 2012-12-18 DIAGNOSIS — J310 Chronic rhinitis: Secondary | ICD-10-CM

## 2012-12-18 DIAGNOSIS — J849 Interstitial pulmonary disease, unspecified: Secondary | ICD-10-CM

## 2012-12-18 LAB — CBC WITH DIFFERENTIAL/PLATELET
Basophils Relative: 0.3 % (ref 0.0–3.0)
Eosinophils Relative: 0 % (ref 0.0–5.0)
HCT: 40.4 % (ref 36.0–46.0)
Hemoglobin: 13.2 g/dL (ref 12.0–15.0)
Lymphs Abs: 1.6 10*3/uL (ref 0.7–4.0)
MCV: 90.1 fl (ref 78.0–100.0)
Monocytes Absolute: 0.6 10*3/uL (ref 0.1–1.0)
Monocytes Relative: 4.9 % (ref 3.0–12.0)
Neutro Abs: 9.8 10*3/uL — ABNORMAL HIGH (ref 1.4–7.7)
Platelets: 308 10*3/uL (ref 150.0–400.0)
WBC: 11.9 10*3/uL — ABNORMAL HIGH (ref 4.5–10.5)

## 2012-12-18 LAB — SEDIMENTATION RATE: Sed Rate: 52 mm/hr — ABNORMAL HIGH (ref 0–22)

## 2012-12-18 LAB — BASIC METABOLIC PANEL
BUN: 15 mg/dL (ref 6–23)
CO2: 26 mEq/L (ref 19–32)
Calcium: 9.1 mg/dL (ref 8.4–10.5)
Creatinine, Ser: 0.7 mg/dL (ref 0.4–1.2)
GFR: 90.6 mL/min (ref >60.00)
Glucose, Bld: 95 mg/dL (ref 70–99)
Sodium: 137 mEq/L (ref 135–145)

## 2012-12-18 MED ORDER — AZELASTINE-FLUTICASONE 137-50 MCG/ACT NA SUSP: 2.0000 | Freq: Every day | NASAL | Status: DC

## 2012-12-18 NOTE — Patient Instructions (Addendum)
Order lab- CBC with Diff, BMET, ANCA, Hypersensitivity Pneumonia panel, ANA, Sed rate     Dx intertitial fibrosis  Order- Schedule CT chest with contrast  Order- Schedule ONOX on room air      Schedule 6 MWT on room air      Ok to continue Advair  1 puff and rinse mouth, twice daily  Sample Dymista nasal spray   1-2 puffs each nostril once daily at bedtime. See if this helps the nasal allergy discomfort

## 2012-12-18 NOTE — Progress Notes (Signed)
Patient ID: Brandi [name], female    DOB: 1933/06/29, 79 y.o.   MRN: [id number]  HPI 04/07/11 68 yoF never smoker swith chronic cough, chronic asthmatic bronchitis, significant hx of varicella encephalitis 2004. Husband here. Last here December 04, 2010.  Family member, a PA-C sends a note asking if cough might be from the old nerve inflammation, since it became noticeable about then, and asks about a trial of neurontin.  PFT 03/28/09- wnl. CXR  had looked like COPD with some scarring.  There is question of whether she may have hydrocephalus and this is being evaluated through a neurologist in [place] for potential shunt.  She coughs all day, worst in the morning, but not productive. Failed tessalon and all inhalers. Not dyspneic. Some seasonal postnasal drip. Denies any relation to food or swallowing. Codeine and vicodin give nausea and vomiting.   06/15/11-  [name] never smoker with chronic cough, chronic asthmatic bronchitis, significant hx of varicella encephalitis 2004, Benign normal pressure hydrocephalus.        Husband here. V-P shunt placed in July for hydrocephalus.. Within 2 days her cough had basically stopped and her thinking is much better.  We discussed how to stop Advair and Singulair for observation. CT chest 05/03/11- diffuse patchy peripheral interstitial lung disease. I reviewed the images with them and discussed significance.  11/01/11- [name] never smoker with chronic cough, chronic asthmatic bronchitis, significant hx of varicella encephalitis 2004, Benign normal pressure hydrocephalus/ VP Shunt, ILD Daughter-in-law here. Interstitial lung disease was documented by CT scan in June. Subsequently she had an acute bronchitis with cough leading to a followup chest CT scan December 3 which I do not find in our system. Vertebral compression fracture was identified, secondary to cough, in October. Since then she has done better. Cough is now "not bad", nonproductive or scant clear  phlegm in the morning. Short of breath only proportional to exertion. She stopped Advair. Avelox caused hallucination and confusion. She and family are pleased with her overall status now.  06/26/12- 78 yoF never smoker with chronic cough, chronic asthmatic bronchitis, significant hx of varicella encephalitis 2004, Benign normal pressure hydrocephalus/ VP Shunt, ILD.        Husband here Chest feels comfortable. She does cough productively in the mornings for a while> Clear white mucus, and then again when first lying down. She says she coughs especially if nervous or upset. Mild DOE with more than usual exertion. Since last here she reports multiple CT scans, mostly of her head but including her chest, related to her VP shunt management by neurosurgery in [place]. She is not aware of aspiration events related to her surgery, or of reflux events now at home. CT chest 10/29/11 IMPRESSION:  1. Pulmonary parenchymal pattern of peribronchovascular and  subpleural densities, traction bronchiectasis/bronchiolectasis and  subpleural reticulation, without zonal predominance, consistent  with interstitial lung disease. Differential diagnosis includes  chronic hypersensitivity pneumonitis and chronic eosinophilic  pneumonia.  2. Coronary artery calcification  3. Upper lumbar vertebral body inferior endplate compression  fracture is new from 09/06/2011.  Original Report Authenticated By: Reyes Ivan, M.D.   12/18/12- [name] never smoker with  ILD, chronic cough, chronic asthmatic bronchitis, significant hx of varicella encephalitis 2004, Benign normal pressure hydrocephalus/ VP Shunt,. 2 daughter's here. FOLLOWS BJY:NWGNFAOZH SOB since last "fall season" and getting worse; steroid was given by Dr Cliffton Asters and helped with cough as well.  11-29-12 tx'd by Dr Katrinka Blazing for bronchitis(CXR done). Breathing has  not getten better per daughter.  12-01-12 was tx'd  by Dr Jerilee Field abx from Ceftin(made patient sick to  stomach) to Zpak and prednisone. Pt states she started back on Advair and has helped slightly. Hard to breathe deep; sinus drainage and productive cough-cloudy in color and sticky. Cough has been more labored especially in the last 2 months with increased dyspnea on exertion. She blames part of the variability of her status on her neurologic problem/history encephalitis/aunt. Higher dose prednisone helped cough which returned as she tapered dose blow about 40 mg daily. There was a distinct extra exacerbation in January associated with a viral URI syndrome. Daughter's have to help her use Advair technique appropriately. It is helpful when used according to directions but she has some trouble getting inhaled medicines deep enough. CXR- 11/30/12- Findings: Coarse and prominent interstitial markings remain  throughout the lungs right more prominently than left most  consistent with pulmonary fibrosis. No active process is noted.  The heart is mildly enlarged and stable. Thoracolumbar scoliosis  is noted. A VP shunt is present.  IMPRESSION:  Stable pulmonary fibrosis. No definite active process.  Original Report Authenticated By: Dwyane Dee, M.D.  Review of Systems-see HPI Constitutional:   No-   weight loss, night sweats, fevers, chills, fatigue, lassitude. HEENT:   No-  headaches, difficulty swallowing, tooth/dental problems, sore throat,       No-  sneezing, itching, ear ache, nasal congestion, post nasal drip,  CV:  No-   chest pain, orthopnea, PND, swelling in lower extremities, anasarca, dizziness, palpitations Resp: + shortness of breath with exertion or at rest.              +   productive cough,  +Mild non-productive cough,  No-  coughing up of blood.              No-   change in color of mucus.  No- wheezing.   Skin: No-   rash or lesions. GI:  No-   heartburn, indigestion, abdominal pain, nausea, vomiting, GU:  MS:  No-   joint pain or swelling. . Neuro- no change in long term  sequelae of her meningitis.  Psych:  No- change in mood or affect. No depression or anxiety.  No memory loss.   Objective:   Physical Exam General- Alert, Oriented, Affect-appropriate, Distress- none acute, talkative and appropriate. Rolling walker. Room air. Skin- rash-none, lesions- none, excoriation- none Lymphadenopathy- none Head- atraumatic            Eyes- Looks mainly with her good eye- squints with left. conjunctivae clear secretions            Ears- Hearing, canals normal in quiet room            Nose- Clear, No-Septal dev, mucus, polyps, erosion, perforation             Throat- Mallampati II , mucosa clear , drainage- none, tonsils- atrophic Neck- flexible , trachea midline, no stridor , thyroid nl, carotid no bruit. + Shunt right neck Chest - symmetrical excursion , unlabored           Heart/CV- RRR , no murmur , no gallop  , no rub, nl s1 s2                           - JVD- none , edema- none, stasis changes- none, varices- none           Lung- +  light rhonchi right apex, wheeze- none, cough+ dry , dullness-none, rub- none           Chest wall-  Abd- Br/ Gen/ Rectal- Not done, not indicated Extrem- cyanosis- none, clubbing, none, atrophy- none, strength- nl Neuro- grossly intact to observation- except that she needs a walker and turns to face with better eye. L lid droop

## 2012-12-19 LAB — ANCA SCREEN W REFLEX TITER
Atypical p-ANCA Screen: NEGATIVE
c-ANCA Screen: NEGATIVE

## 2012-12-22 ENCOUNTER — Inpatient Hospital Stay: Admission: RE | Admit: 2012-12-22 | Payer: Medicare Other | Source: Ambulatory Visit

## 2012-12-22 ENCOUNTER — Other Ambulatory Visit: Payer: Medicare Other

## 2012-12-24 NOTE — Assessment & Plan Note (Signed)
Nonspecific interstitial lung disease does seem to respond to steroids as would be expected with an eosinophilic pneumonia. Plan-overnight oximetry and 6 minute walk test to assess oxygen needs. CT chest with contrast. Labs for ANCA, hypersensitivity panel, CBC, chemistry, ANA, sedimentation rate. High-dose maintenance steroids would carry significant infection risk for her.

## 2012-12-24 NOTE — Assessment & Plan Note (Signed)
She is complaining of nasal stuffiness and postnasal drip. I don't think that is the primary reason for her cough. Plan-try Dymista nasal spray

## 2012-12-25 LAB — HYPERSENSITIVITY PNUEMONITIS PROFILE

## 2012-12-27 ENCOUNTER — Emergency Department (HOSPITAL_COMMUNITY): Payer: Medicare Other

## 2012-12-27 ENCOUNTER — Inpatient Hospital Stay (HOSPITAL_COMMUNITY)
Admission: EM | Admit: 2012-12-27 | Discharge: 2013-01-01 | DRG: 194 | Disposition: A | Discharge status: Home Health | Payer: Medicare Other | Attending: Internal Medicine | Admitting: Internal Medicine

## 2012-12-27 ENCOUNTER — Encounter (HOSPITAL_COMMUNITY): Payer: Self-pay | Admitting: *Deleted

## 2012-12-27 ENCOUNTER — Telehealth: Payer: Self-pay | Admitting: Internal Medicine

## 2012-12-27 ENCOUNTER — Other Ambulatory Visit: Payer: Medicare Other

## 2012-12-27 DIAGNOSIS — G919 Hydrocephalus, unspecified: Secondary | ICD-10-CM

## 2012-12-27 DIAGNOSIS — Z982 Presence of cerebrospinal fluid drainage device: Secondary | ICD-10-CM

## 2012-12-27 DIAGNOSIS — G911 Obstructive hydrocephalus: Secondary | ICD-10-CM | POA: Diagnosis present

## 2012-12-27 DIAGNOSIS — J42 Unspecified chronic bronchitis: Secondary | ICD-10-CM

## 2012-12-27 DIAGNOSIS — J8410 Pulmonary fibrosis, unspecified: Secondary | ICD-10-CM | POA: Diagnosis present

## 2012-12-27 DIAGNOSIS — J45909 Unspecified asthma, uncomplicated: Secondary | ICD-10-CM | POA: Diagnosis present

## 2012-12-27 DIAGNOSIS — G0491 Myelitis, unspecified: Secondary | ICD-10-CM

## 2012-12-27 DIAGNOSIS — J9610 Chronic respiratory failure, unspecified whether with hypoxia or hypercapnia: Secondary | ICD-10-CM

## 2012-12-27 DIAGNOSIS — J310 Chronic rhinitis: Secondary | ICD-10-CM

## 2012-12-27 DIAGNOSIS — R0600 Dyspnea, unspecified: Secondary | ICD-10-CM

## 2012-12-27 DIAGNOSIS — R0902 Hypoxemia: Secondary | ICD-10-CM | POA: Diagnosis present

## 2012-12-27 DIAGNOSIS — J849 Interstitial pulmonary disease, unspecified: Secondary | ICD-10-CM

## 2012-12-27 DIAGNOSIS — J189 Pneumonia, unspecified organism: Principal | ICD-10-CM | POA: Diagnosis present

## 2012-12-27 DIAGNOSIS — R0989 Other specified symptoms and signs involving the circulatory and respiratory systems: Secondary | ICD-10-CM

## 2012-12-27 DIAGNOSIS — G0490 Encephalitis and encephalomyelitis, unspecified: Secondary | ICD-10-CM

## 2012-12-27 LAB — BLOOD GAS, ARTERIAL
Bicarbonate: 26.2 mEq/L — ABNORMAL HIGH (ref 20.0–24.0)
Patient temperature: 98.6
TCO2: 23.5 mmol/L (ref 0–100)
pCO2 arterial: 41.4 mmHg (ref 35.0–45.0)
pH, Arterial: 7.418 (ref 7.350–7.450)
pO2, Arterial: 93.3 mmHg (ref 80.0–100.0)

## 2012-12-27 LAB — BASIC METABOLIC PANEL
BUN: 11 mg/dL (ref 6–23)
Chloride: 99 mEq/L (ref 96–112)
GFR calc Af Amer: >90 mL/min (ref >90)
GFR calc non Af Amer: 83 mL/min — ABNORMAL LOW (ref >90)
Potassium: 3.5 mEq/L (ref 3.5–5.1)
Sodium: 135 mEq/L (ref 135–145)

## 2012-12-27 LAB — URINALYSIS, ROUTINE W REFLEX MICROSCOPIC
Bilirubin Urine: NEGATIVE
Leukocytes, UA: NEGATIVE
Nitrite: NEGATIVE
Specific Gravity, Urine: >1.046 — ABNORMAL HIGH (ref 1.005–1.030)
Urobilinogen, UA: 0.2 mg/dL (ref 0.0–1.0)
pH: 6 (ref 5.0–8.0)

## 2012-12-27 LAB — CBC WITH DIFFERENTIAL/PLATELET
Basophils Relative: 0 % (ref 0–1)
Eosinophils Absolute: 0.3 10*3/uL (ref 0.0–0.7)
Hemoglobin: 12 g/dL (ref 12.0–15.0)
Lymphs Abs: 2 10*3/uL (ref 0.7–4.0)
MCH: 29.3 pg (ref 26.0–34.0)
MCHC: 32.6 g/dL (ref 30.0–36.0)
Monocytes Relative: 8 % (ref 3–12)
Neutro Abs: 5 10*3/uL (ref 1.7–7.7)
Neutrophils Relative %: 63 % (ref 43–77)
Platelets: 326 10*3/uL (ref 150–400)
RBC: 4.09 MIL/uL (ref 3.87–5.11)

## 2012-12-27 MED ORDER — DEXTROSE 5 % IV SOLN
1.0000 g | Freq: Once | INTRAVENOUS | Status: AC
  Administered 2012-12-27: 1 g via INTRAVENOUS
  Filled 2012-12-27: qty 10

## 2012-12-27 MED ORDER — BUPROPION HCL 100 MG PO TABS
100.0000 mg | ORAL_TABLET | Freq: Every morning | ORAL | Status: DC
  Administered 2012-12-28 – 2013-01-01 (×5): 100 mg via ORAL
  Filled 2012-12-27 (×5): qty 1

## 2012-12-27 MED ORDER — DEXTROSE 5 % IV SOLN
500.0000 mg | Freq: Once | INTRAVENOUS | Status: AC
  Administered 2012-12-27: 500 mg via INTRAVENOUS
  Filled 2012-12-27: qty 500

## 2012-12-27 MED ORDER — HEPARIN SODIUM (PORCINE) 5000 UNIT/ML IJ SOLN
5000.0000 [IU] | Freq: Three times a day (TID) | INTRAMUSCULAR | Status: DC
  Administered 2012-12-27 – 2013-01-01 (×14): 5000 [IU] via SUBCUTANEOUS
  Filled 2012-12-27 (×17): qty 1

## 2012-12-27 MED ORDER — MOMETASONE FURO-FORMOTEROL FUM 100-5 MCG/ACT IN AERO
2.0000 | INHALATION_SPRAY | Freq: Two times a day (BID) | RESPIRATORY_TRACT | Status: DC
  Administered 2012-12-28 – 2013-01-01 (×8): 2 via RESPIRATORY_TRACT
  Filled 2012-12-27 (×2): qty 8.8

## 2012-12-27 MED ORDER — AMLODIPINE BESYLATE 5 MG PO TABS
5.0000 mg | ORAL_TABLET | Freq: Every day | ORAL | Status: DC
  Administered 2012-12-27 – 2012-12-31 (×5): 5 mg via ORAL
  Filled 2012-12-27 (×6): qty 1

## 2012-12-27 MED ORDER — SODIUM CHLORIDE 0.9 % IV SOLN
INTRAVENOUS | Status: DC
  Administered 2012-12-27: 1000 mL via INTRAVENOUS
  Administered 2012-12-27: 16:00:00 via INTRAVENOUS

## 2012-12-27 MED ORDER — IOHEXOL 350 MG/ML SOLN
100.0000 mL | Freq: Once | INTRAVENOUS | Status: AC | PRN
  Administered 2012-12-27: 100 mL via INTRAVENOUS

## 2012-12-27 MED ORDER — SODIUM CHLORIDE 0.9 % IJ SOLN: 3.0000 mL | INTRAMUSCULAR | Status: DC | PRN

## 2012-12-27 MED ORDER — ACETAMINOPHEN 500 MG PO TABS
1000.0000 mg | ORAL_TABLET | Freq: Three times a day (TID) | ORAL | Status: DC
  Administered 2012-12-27 – 2013-01-01 (×14): 1000 mg via ORAL
  Filled 2012-12-27 (×19): qty 2

## 2012-12-27 MED ORDER — SODIUM CHLORIDE 0.9 % IJ SOLN
3.0000 mL | Freq: Two times a day (BID) | INTRAMUSCULAR | Status: DC
  Administered 2012-12-27 – 2012-12-31 (×3): 3 mL via INTRAVENOUS

## 2012-12-27 MED ORDER — DONEPEZIL HCL 10 MG PO TABS
10.0000 mg | ORAL_TABLET | Freq: Every day | ORAL | Status: DC
  Administered 2012-12-27 – 2012-12-31 (×5): 10 mg via ORAL
  Filled 2012-12-27 (×6): qty 1

## 2012-12-27 MED ORDER — MONTELUKAST SODIUM 10 MG PO TABS
10.0000 mg | ORAL_TABLET | Freq: Every evening | ORAL | Status: DC
  Administered 2012-12-27 – 2012-12-31 (×5): 10 mg via ORAL
  Filled 2012-12-27 (×6): qty 1

## 2012-12-27 MED ORDER — DEXTROSE 5 % IV SOLN
1.0000 g | INTRAVENOUS | Status: DC
  Administered 2012-12-27 – 2012-12-30 (×4): 1 g via INTRAVENOUS
  Filled 2012-12-27 (×4): qty 10

## 2012-12-27 MED ORDER — SODIUM CHLORIDE 0.9 % IV SOLN: 250.0000 mL | INTRAVENOUS | Status: DC | PRN

## 2012-12-27 MED ORDER — AZITHROMYCIN 500 MG PO TABS
500.0000 mg | ORAL_TABLET | ORAL | Status: DC
  Administered 2012-12-27 – 2012-12-30 (×4): 500 mg via ORAL
  Filled 2012-12-27: qty 2
  Filled 2012-12-27 (×4): qty 1

## 2012-12-27 MED ORDER — SERTRALINE HCL 100 MG PO TABS
100.0000 mg | ORAL_TABLET | Freq: Every morning | ORAL | Status: DC
  Administered 2012-12-28 – 2013-01-01 (×5): 100 mg via ORAL
  Filled 2012-12-27 (×5): qty 1

## 2012-12-27 NOTE — ED Notes (Signed)
Patient transported to CT 

## 2012-12-27 NOTE — Telephone Encounter (Signed)
Per Florentina Addison:  I called Maureen back & advised to take pt to ED.  We will Cx pt's CT for her at the request of Marisue Humble.  Brandi [name]

## 2012-12-27 NOTE — ED Notes (Signed)
WGN:[id number]<HY> Expected date:12/27/12<BR> Expected time: 1:53 PM<BR> Means of arrival:Ambulance<BR> Comments:<BR> SOB

## 2012-12-27 NOTE — Progress Notes (Signed)
Quick Note:  LMTCB for Brandi [name]-pts daughter in law and care giver to call for results. ______

## 2012-12-27 NOTE — ED Notes (Signed)
Pt/family states since the weekend pt has progressively gotten weaker and short of breath, states pt is unable to move w/o getting too short of breath, pt was suppose to go to CT scan today d/t hx of having shunt placed in head, was unable to make it, oxygen level today at home was 82% on RA, called MD and was told to call 911 and come to hospital. Pt has a labored breathing at this time.

## 2012-12-27 NOTE — ED Provider Notes (Signed)
History     CSN: [id number]  Arrival date & time 12/27/12  1400   First MD Initiated Contact with Patient 12/27/12 1511      Chief Complaint  Patient presents with  . Shortness of Breath  . Weakness     HPI Pt was seen at 1520.   Per pt, c/o gradual onset and worsening of persistent cough, SOB and generalized weakness/fatigue for the past 1 month, worse over the past 1 week.  Pt states she was taking 2 courses of different antibiotics, as well as PO steroids without relief.  Pt has been using her home MDI without relief.  Pt's Pulmonary MD ordered a CT chest for today, but pt was unable to get to the appointment due to increasing SOB.  Pt states she "can't even move now" without getting significantly SOB.  Pt's family states pt's O2 Sat at home while walking was "82% R/A."  Pt is not on home O2.  Pt also c/o "my hydrocephalus symptoms are returning" over the past several weeks; described as "urinary and fecal incontinence." States her shunt "drains into my heart" after it was revised due to similar symptoms.  Denies headache, no fevers, no visual changes, no focal motor weakness, no tingling/numbness in extremities, no rash, no CP/palpitations, no back pain, no abd pain, no N/V/D.     Past Medical History  Diagnosis Date  . Asthma   . Shingles   . Herpes, meningitis, zoster     deaf in left ear  . Pulmonary fibrosis   . Normal pressure hydrocephalus     Past Surgical History  Procedure Laterality Date  . Bunionectomy    . Replacement total knee bilateral    . Ventriculoperitoneal shunt      Family History  Problem Relation Age of Onset  . Heart failure Mother   . Emphysema Mother   . Asthma Mother   . Arthritis Mother   . Cancer    . COPD      History  Substance Use Topics  . Smoking status: Never Smoker   . Smokeless tobacco: Never Used  . Alcohol Use: No    Review of Systems ROS: Statement: All systems negative except as marked or noted in the HPI;  Constitutional: Negative for fever and chills. +generalized weakness/fatigue. ; ; Eyes: Negative for eye pain, redness and discharge. ; ; ENMT: Negative for ear pain, hoarseness, nasal congestion, sinus pressure and sore throat. ; ; Cardiovascular: Negative for chest pain, palpitations, diaphoresis, and peripheral edema. ; ; Respiratory: +SOB, cough. Negative for wheezing and stridor. ; ; Gastrointestinal: Negative for nausea, vomiting, diarrhea, abdominal pain, blood in stool, hematemesis, jaundice and rectal bleeding. . ; ; Genitourinary: Negative for dysuria, flank pain and hematuria. ; ; Musculoskeletal: Negative for back pain and neck pain. Negative for swelling and trauma.; ; Skin: Negative for pruritus, rash, abrasions, blisters, bruising and skin lesion.; ; Neuro: Negative for headache, lightheadedness and neck stiffness. Negative for altered level of consciousness , altered mental status, extremity weakness, paresthesias, involuntary movement, seizure and syncope.       Allergies  Avelox; Codeine; Hydrocodone-acetaminophen; and Sulfonamide derivatives  Home Medications   Current Outpatient Rx  Name  Route  Sig  Dispense  Refill  . acetaminophen (TYLENOL) 500 MG tablet   Oral   Take 1,000 mg by mouth 3 (three) times daily.         Marland Kitchen amLODipine (NORVASC) 5 MG tablet   Oral   Take 1 tablet  by mouth every evening.          . Azelastine-Fluticasone (DYMISTA) 137-50 MCG/ACT SUSP   Each Nare   Place 2 sprays into both nostrils at bedtime.   1 Bottle   0   . buPROPion (WELLBUTRIN) 100 MG tablet   Oral   Take 100 mg by mouth every morning.          . donepezil (ARICEPT) 10 MG tablet   Oral   Take 10 mg by mouth every evening.          . ergocalciferol (VITAMIN D2) 50000 UNITS capsule   Oral   Take 50,000 Units by mouth once a week. sundays         . Fluticasone-Salmeterol (ADVAIR) 250-50 MCG/DOSE AEPB   Inhalation   Inhale 1 puff into the lungs every 12 (twelve)  hours.         . montelukast (SINGULAIR) 10 MG tablet   Oral   Take 10 mg by mouth every evening.         . raloxifene (EVISTA) 60 MG tablet   Oral   Take 60 mg by mouth every morning.          . sertraline (ZOLOFT) 100 MG tablet   Oral   Take 100 mg by mouth every morning.            BP 103/81  Pulse 104  Temp(Src) 98.8 F (37.1 C) (Oral)  Resp 26  SpO2 100%  Physical Exam 1525: Physical examination:  Nursing notes reviewed; Vital signs and O2 SAT reviewed;  Constitutional: Thin, no acute distress; Head:  Normocephalic, atraumatic; Eyes: EOMI, PERRL, No scleral icterus; ENMT: Mouth and pharynx normal, Mucous membranes dry; Neck: Supple, Full range of motion, No lymphadenopathy; Cardiovascular: Regular rate and rhythm, No gallop; Respiratory: Breath sounds diminished & equal bilaterally, No wheezes.  Speaking full sentences while sitting, Normal respiratory effort/excursion; Chest: Nontender, Movement normal; Abdomen: Soft, Nontender, Nondistended, Normal bowel sounds; Genitourinary: No CVA tenderness; Extremities: Pulses normal, No tenderness, No edema, No calf edema or asymmetry.; Neuro: AA&Ox3, +HOH, otherwise major CN grossly intact.  Speech clear. No gross focal motor or sensory deficits in extremities.; Skin: Color normal, Warm, Dry.   ED Course  Procedures   MDM  MDM Reviewed: previous chart, nursing note and vitals Reviewed previous: labs, x-ray, CT scan and ECG Interpretation: ECG, labs, x-ray and CT scan      Date: 12/27/2012  Rate: 105  Rhythm: sinus tachycardia and premature ventricular contractions (PVC)  QRS Axis: left  Intervals: PR prolonged  ST/T Wave abnormalities: nonspecific ST/T changes  Conduction Disutrbances:first-degree A-V block   Narrative Interpretation:   Old EKG Reviewed: unchanged; no significant changes from previous EKG dated 09/06/2012.  Results for orders placed during the hospital encounter of 12/27/12  CBC WITH  DIFFERENTIAL      Result Value Range   WBC 7.9  4.0 - 10.5 K/uL   RBC 4.09  3.87 - 5.11 MIL/uL   Hemoglobin 12.0  12.0 - 15.0 g/dL   HCT 16.1  09.6 - 04.5 %   MCV 90.0  78.0 - 100.0 fL   MCH 29.3  26.0 - 34.0 pg   MCHC 32.6  30.0 - 36.0 g/dL   RDW 40.9 (*) 81.1 - 91.4 %   Platelets 326  150 - 400 K/uL   Neutrophils Relative 63  43 - 77 %   Neutro Abs 5.0  1.7 - 7.7 K/uL   Lymphocytes Relative 26  12 - 46 %   Lymphs Abs 2.0  0.7 - 4.0 K/uL   Monocytes Relative 8  3 - 12 %   Monocytes Absolute 0.6  0.1 - 1.0 K/uL   Eosinophils Relative 4  0 - 5 %   Eosinophils Absolute 0.3  0.0 - 0.7 K/uL   Basophils Relative 0  0 - 1 %   Basophils Absolute 0.0  0.0 - 0.1 K/uL  BASIC METABOLIC PANEL      Result Value Range   Sodium 135  135 - 145 mEq/L   Potassium 3.5  3.5 - 5.1 mEq/L   Chloride 99  96 - 112 mEq/L   CO2 27  19 - 32 mEq/L   Glucose, Bld 90  70 - 99 mg/dL   BUN 11  6 - 23 mg/dL   Creatinine, Ser 1.61  0.50 - 1.10 mg/dL   Calcium 8.5  8.4 - 09.6 mg/dL   GFR calc non Af Amer 83 (*) >90 mL/min   GFR calc Af Amer >90  >90 mL/min  TROPONIN I      Result Value Range   Troponin I <0.30  <0.30 ng/mL  BLOOD GAS, ARTERIAL      Result Value Range   O2 Content 2.0     Delivery systems NASAL CANNULA     pH, Arterial 7.418  7.350 - 7.450   pCO2 arterial 41.4  35.0 - 45.0 mmHg   pO2, Arterial 93.3  80.0 - 100.0 mmHg   Bicarbonate 26.2 (*) 20.0 - 24.0 mEq/L   TCO2 23.5  0 - 100 mmol/L   Acid-Base Excess 2.0  0.0 - 2.0 mmol/L   O2 Saturation 97.0     Patient temperature 98.6     Collection site RIGHT RADIAL     Drawn by [id number]     Sample type ARTERIAL DRAW     Allens test (pass/fail) PASS  PASS   Dg Chest 2 View 12/27/2012  *RADIOLOGY REPORT*  Clinical Data: Dry cough for 6 weeks  CHEST - 2 VIEW  Comparison: Chest x-ray of 11/30/2012  Findings: Opacities bilaterally are consistent with previously described pulmonary fibrosis.  However there is more opacity in the periphery of the left  mid lung and superimposed pneumonia cannot be excluded.  No effusion is seen.  Heart size is stable.  There is a thoracolumbar scoliosis present.  A VP shunt overlies the right chest.  IMPRESSION: Pulmonary fibrosis.  Cannot exclude superimposed pneumonia in the left mid lung field peripherally.   Original Report Authenticated By: Dwyane Dee, M.D.    Ct Angio Chest Pe W/cm &/or Wo Cm 12/27/2012  *RADIOLOGY REPORT*  Clinical Data: Shortness of breath.  CT ANGIOGRAPHY CHEST  Technique:  Multidetector CT imaging of the chest using the standard protocol during bolus administration of intravenous contrast. Multiplanar reconstructed images including MIPs were obtained and reviewed to evaluate the vascular anatomy.  Contrast: OMNIPAQUE IOHEXOL 350 MG/ML SOLN  Comparison: Two-view chest x-ray 12/27/2012.  Findings: Pulmonary arterial opacification is satisfactory.  No focal filling defects are present to suggest pulmonary embolus.  The heart size is normal.  No significant mediastinal or axillary adenopathy is present.  There is no significant pleural or pericardial effusion.  Limited imaging of the upper abdomen is unremarkable.  Lung windows demonstrate extensive traction bronchiectasis.  There is progression of height, particularly in the right middle lobe. The peripheral opacities are noted bilaterally.  This all falls in the similar distribution as on the prior  study.  IMPRESSION:  1.  No evidence for pulmonary embolus. 2.  Progression of interstitial lung disease.  This is most compatible with UIP.  The peripheral opacities suggest the possibility of a second process such as the organizing pneumonia. 3.  No acute abnormality.   Original Report Authenticated By: Marin Roberts, M.D.      1800:  Will tx for CAP on CXR/CT.  Pt ambulated with Sats dropping to 82% R/A, with pt becoming significantly SOB and tachycardic.  Pt escorted back to the stretcher, O2 2L N/C applied with Sats increasing to mid-90's.   Denies CP.  Dx and testing d/w pt and family.  Questions answered.  Verb understanding, agreeable to admit.  T/C to [facility] Dr. Cena Benton, case discussed, including:  HPI, pertinent PM/SHx, VS/PE, dx testing, ED course and treatment:  Aware CT-H pending, agreeable to admit, requests he will come to ED for eval.         Laray Anger, DO 12/28/12 1457

## 2012-12-27 NOTE — H&P (Signed)
[facility] History and Physical  Brandi [name] ZOX:[id number] DOB: 22-Mar-1933 DOA: 12/27/2012  Referring physician: Dr. Clarene Duke PCP: Cala Bradford, MD  Specialists: Patient sees Dr. Maple Hudson for her pulmonary conditions.  I have consulted Pulmonology and Spoke to Dr. Delton Coombes. Also has seen and is followed by Dr. Allen Norris with neurosurgery as outpatient given history of hydrocephalus and s/p McClanahan shunt placement.  Discussed case with Dr. Venetia Maxon  Chief Complaint: SOB with exertion  HPI: Brandi [name] is a 79 y.o. 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  Presented to the Ed 2ary to SOB which has been progressively getting worse for the last 10 days.  Patient has been having cough for > 10 days and at the end of January was on antibiotics for CAP.  Reportedly patient has developed worsening SOB with exertion.  The problem is persistent and has not improved.  Nothing makes it better reportedly.  Not associated with hemoptysis.  Patient reports that supplemental oxygen has helped.  While in the ED Patient had CT of head due to complaints of incontinence and difficulty walking.  CT of head showed a slight interval enlargement of the ventricles may suggest a shunt malfunction.  No findings for acute infarction and/or intracranial hemorrhage.  Also had CT angio of chest which was negative for PE but suggested possibility of organizing pna.  WBC was WNL and patient was afebrile with reported desaturation while on room air of 80's  Review of Systems: 10 point review of systems reviewed and negative unless otherwise mentioned above   Past Medical History  Diagnosis Date  . Asthma   . Shingles   . Herpes, meningitis, zoster     deaf in left ear  . Pulmonary fibrosis   . Normal pressure hydrocephalus    Past Surgical History  Procedure  Laterality Date  . Bunionectomy    . Replacement total knee bilateral    . Ventriculoperitoneal shunt     Social History:  reports that she has never smoked. She has never used smokeless tobacco. She reports that she does not drink alcohol or use illicit drugs. Lives at home with husband  Can patient participate in ADLs? Yes   Allergies  Allergen Reactions  . Avelox (Moxifloxacin Hydrochloride) Other (See Comments)    Hallucinations, confusion  . Codeine Nausea And Vomiting  . Hydrocodone-Acetaminophen Nausea And Vomiting  . Sulfonamide Derivatives Other (See Comments)    "tingling sensation"    Family History  Problem Relation Age of Onset  . Heart failure Mother   . Emphysema Mother   . Asthma Mother   . Arthritis Mother   . Cancer    . COPD      Prior to Admission medications   Medication Sig Start Date End Date Taking? Authorizing Provider  acetaminophen (TYLENOL) 500 MG tablet Take 1,000 mg by mouth 3 (three) times daily.   Yes Historical Provider, MD  amLODipine (NORVASC) 5 MG tablet Take 1 tablet by mouth every evening.  12/11/12  Yes Historical Provider, MD  Azelastine-Fluticasone (DYMISTA) 137-50 MCG/ACT SUSP Place 2 sprays into both nostrils at bedtime. 12/18/12  Yes Waymon Budge, MD  buPROPion (WELLBUTRIN) 100 MG tablet Take 100 mg by mouth every morning.    Yes Historical Provider, MD  donepezil (ARICEPT) 10 MG tablet Take 10 mg by mouth every evening.    Yes Historical  Provider, MD  ergocalciferol (VITAMIN D2) 50000 UNITS capsule Take 50,000 Units by mouth once a week. sundays   Yes Historical Provider, MD  Fluticasone-Salmeterol (ADVAIR) 250-50 MCG/DOSE AEPB Inhale 1 puff into the lungs every 12 (twelve) hours.   Yes Historical Provider, MD  montelukast (SINGULAIR) 10 MG tablet Take 10 mg by mouth every evening. 06/01/11  Yes Waymon Budge, MD  raloxifene (EVISTA) 60 MG tablet Take 60 mg by mouth every morning.    Yes Historical Provider, MD  sertraline (ZOLOFT)  100 MG tablet Take 100 mg by mouth every morning.    Yes Historical Provider, MD   Physical Exam: Filed Vitals:   12/27/12 1412 12/27/12 1555 12/27/12 1823  BP: 103/81  146/78  Pulse: 104  104  Temp: 98.8 F (37.1 C)  98.7 F (37.1 C)  TempSrc: Oral  Oral  Resp: 26  17  SpO2: 100% 93% 100%     General:  Pt in NAD, Alert and Awake  Eyes: EOMI, nonicteric  ENT: normal exterior appearance, no masses on visual examination  Neck: supple, no goiter  Cardiovascular: RRR, No MRG  Respiratory: no wheezes, Breath sounds BL, mild rhales at bases  Abdomen: soft, NT, ND  Skin: warm, dry  Musculoskeletal: no cyanosis or clubbing  Psychiatric: mood and affect appropriate  Neurologic: Strength 5/5 at upper and lower extremities, answers questions appropriately, does have facial asymmetry which is not new reportedly or worse, no slurred speech  Labs on Admission:  Basic Metabolic Panel:  Recent Labs Lab 12/27/12 1650  NA 135  K 3.5  CL 99  CO2 27  GLUCOSE 90  BUN 11  CREATININE 0.66  CALCIUM 8.5   Liver Function Tests: No results found for this basename: AST, ALT, ALKPHOS, BILITOT, PROT, ALBUMIN,  in the last 168 hours No results found for this basename: LIPASE, AMYLASE,  in the last 168 hours No results found for this basename: AMMONIA,  in the last 168 hours CBC:  Recent Labs Lab 12/27/12 1650  WBC 7.9  NEUTROABS 5.0  HGB 12.0  HCT 36.8  MCV 90.0  PLT 326   Cardiac Enzymes:  Recent Labs Lab 12/27/12 1650  TROPONINI <0.30    BNP (last 3 results) No results found for this basename: PROBNP,  in the last 8760 hours CBG: No results found for this basename: GLUCAP,  in the last 168 hours  Radiological Exams on Admission: Dg Chest 2 View  12/27/2012  *RADIOLOGY REPORT*  Clinical Data: Dry cough for 6 weeks  CHEST - 2 VIEW  Comparison: Chest x-ray of 11/30/2012  Findings: Opacities bilaterally are consistent with previously described pulmonary fibrosis.   However there is more opacity in the periphery of the left mid lung and superimposed pneumonia cannot be excluded.  No effusion is seen.  Heart size is stable.  There is a thoracolumbar scoliosis present.  A VP shunt overlies the right chest.  IMPRESSION: Pulmonary fibrosis.  Cannot exclude superimposed pneumonia in the left mid lung field peripherally.   Original Report Authenticated By: Dwyane Dee, M.D.    Ct Head Wo Contrast  12/27/2012  *RADIOLOGY REPORT*  Clinical Data: Headache.  Evaluate shunt.  CT HEAD WITHOUT CONTRAST  Technique:  Contiguous axial images were obtained from the base of the skull through the vertex without contrast.  Comparison: 09/06/2012.  Findings: The ventricles are slightly larger when compared with the prior examination which could suggest a shunt malfunction.  Stable cerebral atrophy and periventricular white matter disease.  There is a small amount of a stable dense extra-axial material on the right.  This could be chronic pleural thickening.  No extra-axial blood is identified.  Stable small extra-axial fluid collection in the right cerebellar region.  Contrast is noted in the vessels from a recent contrast enhanced CT scan.  The bony structures are unremarkable.  The paranasal sinuses and mastoid air cells are clear.  IMPRESSION:  1.  Slight interval enlargement of the ventricles may suggest a shunt malfunction. 2.  Stable mild to dural thickening on the right side. 3.  No findings for acute infarction and/or intracranial hemorrhage.   Original Report Authenticated By: Rudie Meyer, M.D.    Ct Angio Chest Pe W/cm &/or Wo Cm  12/27/2012  *RADIOLOGY REPORT*  Clinical Data: Shortness of breath.  CT ANGIOGRAPHY CHEST  Technique:  Multidetector CT imaging of the chest using the standard protocol during bolus administration of intravenous contrast. Multiplanar reconstructed images including MIPs were obtained and reviewed to evaluate the vascular anatomy.  Contrast: OMNIPAQUE  IOHEXOL 350 MG/ML SOLN  Comparison: Two-view chest x-ray 12/27/2012.  Findings: Pulmonary arterial opacification is satisfactory.  No focal filling defects are present to suggest pulmonary embolus.  The heart size is normal.  No significant mediastinal or axillary adenopathy is present.  There is no significant pleural or pericardial effusion.  Limited imaging of the upper abdomen is unremarkable.  Lung windows demonstrate extensive traction bronchiectasis.  There is progression of height, particularly in the right middle lobe. The peripheral opacities are noted bilaterally.  This all falls in the similar distribution as on the prior study.  IMPRESSION:  1.  No evidence for pulmonary embolus. 2.  Progression of interstitial lung disease.  This is most compatible with UIP.  The peripheral opacities suggest the possibility of a second process such as the organizing pneumonia. 3.  No acute abnormality.   Original Report Authenticated By: Marin Roberts, M.D.     EKG: Independently reviewed. Sinus tachycardia with PVC's artifact with no consistent ST elevations or depressions  Assessment/Plan Active Problems:  1. Hypoxia - Most likely due to underlying lung disease presumed to be interstitial lung disease - Consulted Pulmonology for disposition plans and further work up given current condition  2. CAP - treat with Azithromycin and Rocephin (Avoiding avelox as pt reports h/o hallucinations on this medication) - Patient received IV antibiotic prior to order for blood cultures therefore will not obtain blood culture. Pt is non toxic appearing at this juncture - routine labs including sputum culture, urine legionella/strep antigen - supplemental oxygen as needed  3. Presumed interstitial lung disease - continue home regimen - await further recommendations from pulm  4. H/o Hydrocephalus - discussed with Neurosurgeon Dr. Venetia Maxon who has looked over CT of head and indicated that currently the  hydrocephalus when compared to last shunt does not look impressive and does not need intervention at this juncture - Patient to keep her appointment for this Friday as outpatient  5. DVT prophylaxis - heparin   Code Status: full Family Communication: spoke with husband and daughter in law at bedside Disposition Plan: Pending recommendations from pulmonologist. If patient to have prolonged stay in hospital would discuss case with her neurosurgeon for further recommendations for her hydrocephalus.  Time spent: >65 minutes  Penny Pia [facility] Pager [phone]  If 7PM-7AM, please contact night-coverage [email or web address] Password [facility] 12/27/2012, 7:12 PM

## 2012-12-27 NOTE — ED Notes (Signed)
Per EMS pt last 3-4 days, generalized weakness, denies n/v/d, has gotten worse, shortness of breath present on EMS arrival, lungs clear w/ diminished in bases, pt states has had a dry cough and sometimes coughing up "cloudy" mucus,  placed on 4L [place], BP 150/90, HR 100, 20G L hand NSL.

## 2012-12-27 NOTE — ED Notes (Signed)
Floor Unit RN unavailable to take report on pt at this time--- requested to be called back in 15 minutes.

## 2012-12-27 NOTE — ED Notes (Signed)
Pt ambulated to restroom, very short of breath, oxygen sats 82-86% when walking, placed on 2L [place] when back in bed d/t shortness of breath and oxygen sat 86%.

## 2012-12-27 NOTE — ED Notes (Signed)
Admitting MD, Dr. Cena Benton, was called on orders for blood cultures and was made aware that pt has already received Rocephin IV antibiotic and Zithromax is currently infusing-- received telephone order to discontinue blood cultures, states pt has already received first dose of IV antibiotic and pt is "not toxic".

## 2012-12-27 NOTE — Telephone Encounter (Signed)
I have called and confirmed that Rose at CT did cancel the CT scan. I will forward message to CY as an FYI.

## 2012-12-27 NOTE — Telephone Encounter (Signed)
I spoke with CY about this

## 2012-12-27 NOTE — ED Notes (Signed)
Respiratory called for ABG

## 2012-12-28 ENCOUNTER — Inpatient Hospital Stay (HOSPITAL_COMMUNITY): Payer: Medicare Other

## 2012-12-28 LAB — BASIC METABOLIC PANEL
BUN: 10 mg/dL (ref 6–23)
CO2: 27 mEq/L (ref 19–32)
Chloride: 105 mEq/L (ref 96–112)
Creatinine, Ser: 0.63 mg/dL (ref 0.50–1.10)
GFR calc Af Amer: >90 mL/min (ref >90)

## 2012-12-28 LAB — PROCALCITONIN: Procalcitonin: <0.1 ng/mL

## 2012-12-28 LAB — HIV ANTIBODY (ROUTINE TESTING W REFLEX): HIV: NONREACTIVE

## 2012-12-28 LAB — CBC
HCT: 36.3 % (ref 36.0–46.0)
MCHC: 32.5 g/dL (ref 30.0–36.0)
MCV: 91.2 fL (ref 78.0–100.0)
RDW: 16.3 % — ABNORMAL HIGH (ref 11.5–15.5)

## 2012-12-28 LAB — URINE CULTURE

## 2012-12-28 MED ORDER — FAMOTIDINE 20 MG PO TABS
20.0000 mg | ORAL_TABLET | Freq: Every day | ORAL | Status: DC
  Administered 2012-12-29 – 2012-12-31 (×4): 20 mg via ORAL
  Filled 2012-12-28 (×6): qty 1

## 2012-12-28 MED ORDER — PREDNISONE 20 MG PO TABS
40.0000 mg | ORAL_TABLET | Freq: Two times a day (BID) | ORAL | Status: DC
  Administered 2012-12-29 – 2013-01-01 (×7): 40 mg via ORAL
  Filled 2012-12-28 (×9): qty 2

## 2012-12-28 MED ORDER — PANTOPRAZOLE SODIUM 40 MG PO TBEC
40.0000 mg | DELAYED_RELEASE_TABLET | Freq: Two times a day (BID) | ORAL | Status: DC
  Administered 2012-12-29 – 2013-01-01 (×7): 40 mg via ORAL
  Filled 2012-12-28 (×11): qty 1

## 2012-12-28 NOTE — Progress Notes (Signed)
Unable to obtain urine speci, due to pt had BM with urine,

## 2012-12-28 NOTE — Progress Notes (Signed)
UR completed 

## 2012-12-28 NOTE — Progress Notes (Signed)
Pt admitted from the ED with shortness of breath. Family at the beside. Pt orientated to the unit and given pt care guide. Vital signs stable.Pt denies any pain. Pt is on 2L of oxygen. Pt requesting food, pt given snacks. Pt has no questions or concerns at this time.

## 2012-12-28 NOTE — Progress Notes (Signed)
Brief Nutrition Note   Pt's chart reviewed. Spoke with pt, husband at bedside.  Pt reports good appetite PTA and that she "grazes" throughout the day most days rather than eating regularly scheduled meals. She states she likes variety. Pt currently on a Heart Healthy diet and ate 100% of breakfast this morning.   She states her usual weight is 145-150 pounds and that, although she has lost weight over the last few months, she is where she wants to be concerning her weight. She states that she knows she is using more energy because of her ongoing SOB and that this also interferes with her being able to eat at times. Her current weight is 135 pounds and her BMI is 20.6-Normal.  Talked with pt about adding a nutrition supplement such as Ensure and she feels that she does not need one at this time based on her current intake and appetite. She understands that if she would like one she can ask the nurse. Please provide her with Ensure or Resource Breeze if she requests it.    No further nutrition intervention warranted at this time. Please consult RD if nutrition intervention is needed.  Trenton Gammon Dietetic Intern # [phone]

## 2012-12-28 NOTE — Progress Notes (Signed)
[facility] PROGRESS NOTE  Brandi [name] ZOX:[id number] DOB: 1933/08/21 DOA: 12/27/2012 PCP: Cala Bradford, MD  Assessment/Plan Hypoxia - Most likely due to underlying lung disease presumed to be interstitial lung disease  - Consulted Pulmonology for disposition plans and further work up given current condition   CAP  - treat with Azithromycin and Rocephin (Avoiding avelox as pt reports h/o hallucinations on this medication)  - Patient received IV antibiotic prior to order for blood cultures therefore will not obtain blood culture. Pt is non toxic appearing at this juncture  - routine labs including sputum culture, urine legionella/strep antigen  - supplemental oxygen as needed- may very need O2 at home  Presumed interstitial lung disease  - continue home regimen  - await further recommendations from pulm   H/o Hydrocephalus  - discussed with Neurosurgeon Dr. Venetia Maxon who has looked over CT of head and indicated that currently the hydrocephalus when compared to last shunt does not look impressive and does not need intervention at this juncture  - Patient has an appointment for this Friday as outpatient   DVT prophylaxis  - heparin   Code Status: full Family Communication: daughter at bedside Disposition Plan: home 1-2 days once work up done   Consultants:    Procedures:    Antibiotics:  Rocephin/azithro  HPI/Subjective: Patient still having breathing difficulties No SOB No CP  Objective: Filed Vitals:   12/27/12 2130 12/28/12 0500 12/28/12 0619 12/28/12 0822  BP: 147/90  142/88   Pulse: 94  98   Temp: 98.4 F (36.9 C)  97.8 F (36.6 C)   TempSrc: Oral  Oral   Resp: 22  20   Height:  5\' 8"  (1.727 m)    Weight:  61.508 kg (135 lb 9.6 oz)    SpO2: 98%  100% 96%    Intake/Output Summary (Last 24 hours) at 12/28/12 0929 Last data filed at 12/28/12 0819  Gross per 24 hour  Intake    840 ml  Output      0 ml  Net    840 ml   Filed Weights    12/28/12 0500  Weight: 61.508 kg (135 lb 9.6 oz)    Exam:   General:  A+Ox3, NAD- some breathlessness with talking  Cardiovascular: rrr  Respiratory: dec BS, no wheezing, no crackles  Abdomen: +BS, soft, NT/ND  Data Reviewed: Basic Metabolic Panel:  Recent Labs Lab 12/27/12 1650 12/28/12 0500  NA 135 139  K 3.5 3.8  CL 99 105  CO2 27 27  GLUCOSE 90 89  BUN 11 10  CREATININE 0.66 0.63  CALCIUM 8.5 8.4   Liver Function Tests: No results found for this basename: AST, ALT, ALKPHOS, BILITOT, PROT, ALBUMIN,  in the last 168 hours No results found for this basename: LIPASE, AMYLASE,  in the last 168 hours No results found for this basename: AMMONIA,  in the last 168 hours CBC:  Recent Labs Lab 12/27/12 1650 12/28/12 0500  WBC 7.9 6.4  NEUTROABS 5.0  --   HGB 12.0 11.8*  HCT 36.8 36.3  MCV 90.0 91.2  PLT 326 293   Cardiac Enzymes:  Recent Labs Lab 12/27/12 1650  TROPONINI <0.30   BNP (last 3 results) No results found for this basename: PROBNP,  in the last 8760 hours CBG: No results found for this basename: GLUCAP,  in the last 168 hours  No results found for this or any previous visit (from the past 240 hour(s)).   Studies: Dg Chest  2 View  12/27/2012  *RADIOLOGY REPORT*  Clinical Data: Dry cough for 6 weeks  CHEST - 2 VIEW  Comparison: Chest x-ray of 11/30/2012  Findings: Opacities bilaterally are consistent with previously described pulmonary fibrosis.  However there is more opacity in the periphery of the left mid lung and superimposed pneumonia cannot be excluded.  No effusion is seen.  Heart size is stable.  There is a thoracolumbar scoliosis present.  A VP shunt overlies the right chest.  IMPRESSION: Pulmonary fibrosis.  Cannot exclude superimposed pneumonia in the left mid lung field peripherally.   Original Report Authenticated By: Dwyane Dee, M.D.    Ct Head Wo Contrast  12/27/2012  *RADIOLOGY REPORT*  Clinical Data: Headache.  Evaluate shunt.  CT  HEAD WITHOUT CONTRAST  Technique:  Contiguous axial images were obtained from the base of the skull through the vertex without contrast.  Comparison: 09/06/2012.  Findings: The ventricles are slightly larger when compared with the prior examination which could suggest a shunt malfunction.  Stable cerebral atrophy and periventricular white matter disease.  There is a small amount of a stable dense extra-axial material on the right.  This could be chronic pleural thickening.  No extra-axial blood is identified.  Stable small extra-axial fluid collection in the right cerebellar region.  Contrast is noted in the vessels from a recent contrast enhanced CT scan.  The bony structures are unremarkable.  The paranasal sinuses and mastoid air cells are clear.  IMPRESSION:  1.  Slight interval enlargement of the ventricles may suggest a shunt malfunction. 2.  Stable mild to dural thickening on the right side. 3.  No findings for acute infarction and/or intracranial hemorrhage.   Original Report Authenticated By: Rudie Meyer, M.D.    Ct Angio Chest Pe W/cm &/or Wo Cm  12/27/2012  *RADIOLOGY REPORT*  Clinical Data: Shortness of breath.  CT ANGIOGRAPHY CHEST  Technique:  Multidetector CT imaging of the chest using the standard protocol during bolus administration of intravenous contrast. Multiplanar reconstructed images including MIPs were obtained and reviewed to evaluate the vascular anatomy.  Contrast: OMNIPAQUE IOHEXOL 350 MG/ML SOLN  Comparison: Two-view chest x-ray 12/27/2012.  Findings: Pulmonary arterial opacification is satisfactory.  No focal filling defects are present to suggest pulmonary embolus.  The heart size is normal.  No significant mediastinal or axillary adenopathy is present.  There is no significant pleural or pericardial effusion.  Limited imaging of the upper abdomen is unremarkable.  Lung windows demonstrate extensive traction bronchiectasis.  There is progression of height, particularly in the  right middle lobe. The peripheral opacities are noted bilaterally.  This all falls in the similar distribution as on the prior study.  IMPRESSION:  1.  No evidence for pulmonary embolus. 2.  Progression of interstitial lung disease.  This is most compatible with UIP.  The peripheral opacities suggest the possibility of a second process such as the organizing pneumonia. 3.  No acute abnormality.   Original Report Authenticated By: Marin Roberts, M.D.     Scheduled Meds: . acetaminophen  1,000 mg Oral TID  . amLODipine  5 mg Oral QHS  . azithromycin  500 mg Oral Q24H  . buPROPion  100 mg Oral q morning - 10a  . cefTRIAXone (ROCEPHIN)  IV  1 g Intravenous Q24H  . donepezil  10 mg Oral QHS  . heparin  5,000 Units Subcutaneous Q8H  . mometasone-formoterol  2 puff Inhalation BID  . montelukast  10 mg Oral QPM  . sertraline  100 mg Oral q morning - 10a  . sodium chloride  3 mL Intravenous Q12H   Continuous Infusions: . sodium chloride 1,000 mL (12/27/12 2215)    Principal Problem:   CAP (community acquired pneumonia) Active Problems:   ASTHMA   Interstitial lung disease   Hydrocephalus   Hypoxia    Time spent: 35    [facility], Aune Adami  [facility] Pager [phone]. If 8PM-8AM, please contact night-coverage at [email or web address], password [facility] 12/28/2012, 9:29 AM  LOS: 1 day

## 2012-12-28 NOTE — Consult Note (Signed)
PULMONARY  / CRITICAL CARE MEDICINE  Name: Brandi [name] MRN: [id number] DOB: February 22, 1933    ADMISSION DATE:  12/27/2012 CONSULTATION DATE:  2/20  REFERRING MD :  vann  CHIEF COMPLAINT:  pulm fibrosis   BRIEF PATIENT DESCRIPTION:    79 y.o. female With h/o ILD not characterized by lung biopsy, ANCA and HS panel negative. 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  Presented to the Ed 2/19 w/ CC: SOB which has been progressively getting worse for 7 days. Patient has chronic cough which has been worse and at the end of January was on antibiotics for CAP. Reportedly patient has developed worsening SOB with exertion. The problem is persistent and has improved w/ oxygen.  PCCM  asked 2/20 to see to further comment on potential course of therapy.    SIGNIFICANT EVENTS / STUDIES:  CT chest 2/19: 1. No evidence for pulmonary embolus. 2. Progression of interstitial lung disease. This is most  compatible with UIP. The peripheral opacities suggest the possibility of a second process such as the organizing pneumonia.  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 Presented to the Ed 2/19 with  SOB which has been progressively getting worsex10 days. Patient has been having cough for > 10 days and at the end of January was on antibiotics for CAP. Reportedly patient has developed worsening SOB with exertion. The problem is persistent and has not improved. Nothing makes it better reportedly. Not associated with hemoptysis. Patient reports that supplemental oxygen has helped   No obvious daytime variabilty or assoc chronic cough or cp or chest tightness, subjective wheeze overt sinus or hb symptoms. No  unusual exp hx   eg to chemo or amio or macrodantin or overt connective tissue dz.  Pos response to prednisone in past noted  ROS  The following are not active complaints unless bolded sore throat, dysphagia, dental problems, itching, sneezing,  nasal congestion or excess/ purulent secretions, ear ache,   fever, chills, sweats, unintended wt loss, pleuritic or exertional cp, hemoptysis,  orthopnea pnd or leg swelling, presyncope, palpitations, heartburn, abdominal pain, anorexia, nausea, vomiting, diarrhea  or change in bowel or urinary habits, change in stools or urine, dysuria,hematuria,  rash, arthralgias, visual complaints, headache, numbness weakness or ataxia or problems with walking or coordination,  change in mood/affect or memory.        PAST MEDICAL HISTORY :  Past Medical History  Diagnosis Date  . Asthma   . Shingles   . Herpes, meningitis, zoster     deaf in left ear  . Pulmonary fibrosis   . Normal pressure hydrocephalus    Past Surgical History  Procedure Laterality Date  . Bunionectomy    . Replacement total knee bilateral    . Ventriculoperitoneal shunt     Prior to Admission medications   Medication Sig Start Date End Date Taking? Authorizing Provider  acetaminophen (TYLENOL) 500 MG tablet Take 1,000 mg by mouth 3 (three) times daily.   Yes Historical Provider, MD  amLODipine (NORVASC) 5 MG tablet Take 1 tablet by mouth every evening.  12/11/12  Yes Historical Provider, MD  Azelastine-Fluticasone (DYMISTA) 137-50 MCG/ACT SUSP Place 2 sprays into both nostrils at bedtime.  12/18/12  Yes Waymon Budge, MD  buPROPion (WELLBUTRIN) 100 MG tablet Take 100 mg by mouth every morning.    Yes Historical Provider, MD  donepezil (ARICEPT) 10 MG tablet Take 10 mg by mouth every evening.    Yes Historical Provider, MD  ergocalciferol (VITAMIN D2) 50000 UNITS capsule Take 50,000 Units by mouth once a week. sundays   Yes Historical Provider, MD  Fluticasone-Salmeterol (ADVAIR) 250-50  MCG/DOSE AEPB Inhale 1 puff into the lungs every 12 (twelve) hours.   Yes Historical Provider, MD  montelukast (SINGULAIR) 10 MG tablet Take 10 mg by mouth every evening. 06/01/11  Yes Waymon Budge, MD  raloxifene (EVISTA) 60 MG tablet Take 60 mg by mouth every morning.    Yes Historical Provider, MD  sertraline (ZOLOFT) 100 MG tablet Take 100 mg by mouth every morning.    Yes Historical Provider, MD   Allergies  Allergen Reactions  . Avelox (Moxifloxacin Hydrochloride) Other (See Comments)    Hallucinations, confusion  . Codeine Nausea And Vomiting  . Hydrocodone-Acetaminophen Nausea And Vomiting  . Sulfonamide Derivatives Other (See Comments)    "tingling sensation"    FAMILY HISTORY:  Family History  Problem Relation Age of Onset  . Heart failure Mother   . Emphysema Mother   . Asthma Mother   . Arthritis Mother   . Cancer    . COPD     SOCIAL HISTORY:  reports that she has never smoked. She has never used smokeless tobacco. She reports that she does not drink alcohol or use illicit drugs.     SUBJECTIVE:  nad at rest on 02  VITAL SIGNS: Temp:  [97.8 F (36.6 C)-98.8 F (37.1 C)] 97.8 F (36.6 C) (02/20 0619) Pulse Rate:  [94-104] 98 (02/20 0619) Resp:  [17-26] 20 (02/20 0619) BP: (103-147)/(78-90) 142/88 mmHg (02/20 0619) SpO2:  [93 %-100 %] 96 % (02/20 0822) FiO2 (%):  [32 %] 32 % (02/19 2129) Weight:  [61.508 kg (135 lb 9.6 oz)] 61.508 kg (135 lb 9.6 oz) (02/20 0500) Room air  PHYSICAL EXAMINATION: General:  Awake, no acute distress  Neuro:  oriented no focal def  HEENT:  [place], no JVD  Cardiovascular:  Irregular irregular  Lungs:  Dry elastic crackles  Abdomen:  Soft, non-tender  Musculoskeletal: intact  Skin: intact    Recent Labs Lab 12/27/12 1650 12/28/12 0500  NA 135 139  K 3.5 3.8  CL 99 105  CO2 27 27  BUN 11 10  CREATININE 0.66 0.63  GLUCOSE 90 89    Recent Labs Lab 12/27/12 1650 12/28/12 0500  HGB 12.0 11.8*  HCT 36.8 36.3  WBC  7.9 6.4  PLT 326 293   BNP No results found for this basename: probnp    Recent Labs Lab 12/27/12 1650 12/28/12 0500  WBC 7.9 6.4    ASSESSMENT / PLAN: Acute on Chronic pulmonary infiltrates w/ resultant dyspnea -work up to date negative ANCA and hypersensitivity panel.  -Diff dx: IPF flare, infection, heart failure. Has responded clinically to steroids in past.  Rec: Repeat ESR Check procalcitonin  Check BNP--> if abnormal get echo Cont abx empirically   12 lead given irreg HR  Check viral screen   Use of PPI is associated with improved survival time and with decreased radiologic fibrosis per King's study published in AJRCCM vol 184 p1390.  Dec 2011  This may not be cause and effect, but given how universally unhelpful all the otherstudy drugs have been for pf,  rec start  rx ppi / diet/ lifestyle modification.     Sandrea Hughs, MD Pulmonary and Critical Care Medicine [facility] Cell [phone] After 5:30 PM or weekends, call [phone]

## 2012-12-29 LAB — LEGIONELLA ANTIGEN, URINE

## 2012-12-29 LAB — BASIC METABOLIC PANEL
Calcium: 9.2 mg/dL (ref 8.4–10.5)
GFR calc non Af Amer: 83 mL/min — ABNORMAL LOW (ref >90)
Potassium: 4.2 mEq/L (ref 3.5–5.1)
Sodium: 139 mEq/L (ref 135–145)

## 2012-12-29 LAB — EPSTEIN-BARR VIRUS VCA, IGM: EBV VCA IgM: <10 U/mL (ref <36.0)

## 2012-12-29 LAB — CBC
MCH: 30.1 pg (ref 26.0–34.0)
MCHC: 32.9 g/dL (ref 30.0–36.0)
Platelets: 301 10*3/uL (ref 150–400)
RBC: 4.28 MIL/uL (ref 3.87–5.11)

## 2012-12-29 LAB — CMV IGM: CMV IgM: 8.51 AU/mL (ref <30.00)

## 2012-12-29 NOTE — Progress Notes (Signed)
[facility] PROGRESS NOTE  Brandi [name] ZOX:[id number] DOB: [birth year] DOA: 12/27/2012 PCP: Cala Bradford, MD  Assessment/Plan Hypoxia - Most likely due to underlying lung disease presumed to be interstitial lung disease  - appreciate pulm: PPI added  CAP  - treat with Azithromycin and Rocephin (Avoiding avelox as pt reports h/o hallucinations on this medication)  - Patient received IV antibiotic prior to order for blood cultures therefore will not obtain blood culture. Pt is non toxic appearing at this juncture  - routine labs including sputum culture, urine legionella/strep antigen  - supplemental oxygen as needed- may very need O2 at home -viral panel  Presumed interstitial lung disease  - continue home regimen  - await further recommendations from pulm   H/o Hydrocephalus  - discussed with Neurosurgeon Dr. Venetia Maxon who has looked over CT of head and indicated that currently the hydrocephalus when compared to last shunt does not look impressive and does not need intervention at this juncture  - Patient has an appointment for this Friday as outpatient   DVT prophylaxis  - heparin   Code Status: full Family Communication: daughter at bedside Disposition Plan: home 1-2 days once work up done   Consultants:    Procedures:    Antibiotics:  Rocephin/azithro  HPI/Subjective: Wanting to get up out of bed today Breathing seems stable to patient  Objective: Filed Vitals:   12/28/12 2200 12/28/12 2220 12/29/12 0009 12/29/12 0600  BP: 133/90   122/74  Pulse: 99  101 89  Temp: 98.1 F (36.7 C)   98.3 F (36.8 C)  TempSrc: Oral   Oral  Resp: 18   18  Height:      Weight:      SpO2: 96% 97%  98%    Intake/Output Summary (Last 24 hours) at 12/29/12 1233 Last data filed at 12/29/12 0745  Gross per 24 hour  Intake    300 ml  Output    101 ml  Net    199 ml   Filed Weights   12/28/12 0500  Weight: 61.508 kg (135 lb 9.6 oz)    Exam:   General:   A+Ox3, NAD- some breathlessness with talking  Cardiovascular: rrr  Respiratory: dec BS, no wheezing, no crackles  Abdomen: +BS, soft, NT/ND  Data Reviewed: Basic Metabolic Panel:  Recent Labs Lab 12/27/12 1650 12/28/12 0500 12/29/12 0455  NA 135 139 139  K 3.5 3.8 4.2  CL 99 105 101  CO2 27 27 27   GLUCOSE 90 89 93  BUN 11 10 12   CREATININE 0.66 0.63 0.65  CALCIUM 8.5 8.4 9.2   Liver Function Tests: No results found for this basename: AST, ALT, ALKPHOS, BILITOT, PROT, ALBUMIN,  in the last 168 hours No results found for this basename: LIPASE, AMYLASE,  in the last 168 hours No results found for this basename: AMMONIA,  in the last 168 hours CBC:  Recent Labs Lab 12/27/12 1650 12/28/12 0500 12/29/12 0455  WBC 7.9 6.4 8.4  NEUTROABS 5.0  --   --   HGB 12.0 11.8* 12.9  HCT 36.8 36.3 39.2  MCV 90.0 91.2 91.6  PLT 326 293 301   Cardiac Enzymes:  Recent Labs Lab 12/27/12 1650  TROPONINI <0.30   BNP (last 3 results)  Recent Labs  12/28/12 1559  PROBNP 241.4   CBG: No results found for this basename: GLUCAP,  in the last 168 hours  Recent Results (from the past 240 hour(s))  URINE CULTURE  Status: None   Collection Time    12/27/12  6:00 PM      Result Value Range Status   Specimen Description URINE, CLEAN CATCH   Final   Special Requests NONE   Final   Culture  Setup Time 12/28/2012 03:09   Final   Colony Count 40,000 COLONIES/ML   Final   Culture     Final   Value: Multiple bacterial morphotypes present, none predominant. Suggest appropriate recollection if clinically indicated.   Report Status 12/28/2012 FINAL   Final     Studies: Dg Chest 2 View  12/27/2012  *RADIOLOGY REPORT*  Clinical Data: Dry cough for 6 weeks  CHEST - 2 VIEW  Comparison: Chest x-ray of 11/30/2012  Findings: Opacities bilaterally are consistent with previously described pulmonary fibrosis.  However there is more opacity in the periphery of the left mid lung and  superimposed pneumonia cannot be excluded.  No effusion is seen.  Heart size is stable.  There is a thoracolumbar scoliosis present.  A VP shunt overlies the right chest.  IMPRESSION: Pulmonary fibrosis.  Cannot exclude superimposed pneumonia in the left mid lung field peripherally.   Original Report Authenticated By: Dwyane Dee, M.D.    Ct Head Wo Contrast  12/27/2012  *RADIOLOGY REPORT*  Clinical Data: Headache.  Evaluate shunt.  CT HEAD WITHOUT CONTRAST  Technique:  Contiguous axial images were obtained from the base of the skull through the vertex without contrast.  Comparison: 09/06/2012.  Findings: The ventricles are slightly larger when compared with the prior examination which could suggest a shunt malfunction.  Stable cerebral atrophy and periventricular white matter disease.  There is a small amount of a stable dense extra-axial material on the right.  This could be chronic pleural thickening.  No extra-axial blood is identified.  Stable small extra-axial fluid collection in the right cerebellar region.  Contrast is noted in the vessels from a recent contrast enhanced CT scan.  The bony structures are unremarkable.  The paranasal sinuses and mastoid air cells are clear.  IMPRESSION:  1.  Slight interval enlargement of the ventricles may suggest a shunt malfunction. 2.  Stable mild to dural thickening on the right side. 3.  No findings for acute infarction and/or intracranial hemorrhage.   Original Report Authenticated By: Rudie Meyer, M.D.    Ct Angio Chest Pe W/cm &/or Wo Cm  12/27/2012  *RADIOLOGY REPORT*  Clinical Data: Shortness of breath.  CT ANGIOGRAPHY CHEST  Technique:  Multidetector CT imaging of the chest using the standard protocol during bolus administration of intravenous contrast. Multiplanar reconstructed images including MIPs were obtained and reviewed to evaluate the vascular anatomy.  Contrast: OMNIPAQUE IOHEXOL 350 MG/ML SOLN  Comparison: Two-view chest x-ray 12/27/2012.   Findings: Pulmonary arterial opacification is satisfactory.  No focal filling defects are present to suggest pulmonary embolus.  The heart size is normal.  No significant mediastinal or axillary adenopathy is present.  There is no significant pleural or pericardial effusion.  Limited imaging of the upper abdomen is unremarkable.  Lung windows demonstrate extensive traction bronchiectasis.  There is progression of height, particularly in the right middle lobe. The peripheral opacities are noted bilaterally.  This all falls in the similar distribution as on the prior study.  IMPRESSION:  1.  No evidence for pulmonary embolus. 2.  Progression of interstitial lung disease.  This is most compatible with UIP.  The peripheral opacities suggest the possibility of a second process such as the organizing pneumonia. 3.  No acute abnormality.   Original Report Authenticated By: Marin Roberts, M.D.    Dg Chest Port 1 View  12/28/2012  *RADIOLOGY REPORT*  Clinical Data: Shortness of breath and cough.  PORTABLE CHEST - 1 VIEW  Comparison: CT scan 12/27/2012.  Plain films of the chest 12/27/2012 and 06/26/2012.  Findings: Extensive bilateral airspace disease and changes of pulmonary fibrosis do not appear changed since the most recent plain films.  No pneumothorax or pleural fluid is seen.  Heart size is normal.  Scoliosis is noted.  IMPRESSION: No change in pulmonary fibrosis and possible superimposed pneumonia.   Original Report Authenticated By: Holley Dexter, M.D.     Scheduled Meds: . acetaminophen  1,000 mg Oral TID  . amLODipine  5 mg Oral QHS  . azithromycin  500 mg Oral Q24H  . buPROPion  100 mg Oral q morning - 10a  . cefTRIAXone (ROCEPHIN)  IV  1 g Intravenous Q24H  . donepezil  10 mg Oral QHS  . famotidine  20 mg Oral QHS  . heparin  5,000 Units Subcutaneous Q8H  . mometasone-formoterol  2 puff Inhalation BID  . montelukast  10 mg Oral QPM  . pantoprazole  40 mg Oral BID AC  . predniSONE  40  mg Oral BID WC  . sertraline  100 mg Oral q morning - 10a  . sodium chloride  3 mL Intravenous Q12H   Continuous Infusions:    Principal Problem:   CAP (community acquired pneumonia) Active Problems:   ASTHMA   Interstitial lung disease   Hydrocephalus   Hypoxia    Time spent: 35    [facility], Heidemarie Goodnow  [facility] Pager [phone]. If 8PM-8AM, please contact night-coverage at [email or web address], password [facility] 12/29/2012, 12:33 PM  LOS: 2 days

## 2012-12-29 NOTE — Progress Notes (Signed)
PULMONARY  / CRITICAL CARE MEDICINE  Name: Brandi [name] MRN: [id number] DOB: Apr 24, 1933    ADMISSION DATE:  12/27/2012 CONSULTATION DATE:  2/20  REFERRING MD :  vann  CHIEF COMPLAINT:  pulm fibrosis   BRIEF PATIENT DESCRIPTION:    79 y.o. female With h/o ILD not characterized by lung biopsy, ANCA and HS panel negative. 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  Presented to the Ed 2/19 w/ CC: SOB which has been progressively getting worse for 7 days. Patient has chronic cough which has been worse and at the end of January was on antibiotics for CAP. Reportedly patient has developed worsening SOB with exertion. The problem is persistent and has improved w/ oxygen.  PCCM  asked 2/20 to see to further comment on potential course of therapy.    SIGNIFICANT EVENTS / STUDIES:  CT chest 2/19: 1. No evidence for pulmonary embolus. 2. Progression of interstitial lung disease. This is most  compatible with UIP. The peripheral opacities suggest the possibility of a second process such as the organizing pneumonia.  CULTURES: BC 2/19>>> UC 2/19>>> u strep 2/19: neg u legionella 2/19> neg ebv 2/20>>> rsv 2/20>>> cmv 2/20>>>  ANTIBIOTICS: 2/19 azithro >>> 2/19 rocephin >>>    SUBJECTIVE:  nad at rest on 02  VITAL SIGNS: Temp:  [98.1 F (36.7 C)-99.4 F (37.4 C)] 98.3 F (36.8 C) (02/21 0600) Pulse Rate:  [89-104] 89 (02/21 0600) Resp:  [18] 18 (02/21 0600) BP: (122-147)/(74-90) 122/74 mmHg (02/21 0600) SpO2:  [96 %-98 %] 98 % (02/21 0600) 02 rx  2 liters   PHYSICAL EXAMINATION: General:  Awake, no acute distress  Neuro:  oriented no focal def  HEENT:  [place], no JVD  Cardiovascular:  Irregular irregular  Lungs:  Dry elastic crackles  Abdomen:  Soft, non-tender  Musculoskeletal: intact  Skin: intact    Recent Labs Lab 12/27/12 1650 12/28/12 0500 12/29/12 0455  NA 135 139 139  K 3.5 3.8 4.2  CL 99 105 101   CO2 27 27 27   BUN 11 10 12   CREATININE 0.66 0.63 0.65  GLUCOSE 90 89 93    Recent Labs Lab 12/27/12 1650 12/28/12 0500 12/29/12 0455  HGB 12.0 11.8* 12.9  HCT 36.8 36.3 39.2  WBC 7.9 6.4 8.4  PLT 326 293 301   BNP    Component Value Date/Time   PROBNP 241.4 12/28/2012 1559    Recent Labs Lab 12/27/12 1650 12/28/12 0500 12/28/12 1558 12/29/12 0455  PROCALCITON  --   --  <0.10  --   WBC 7.9 6.4  --  8.4   Erythrocyte Sedimentation Rate     Component Value Date/Time   ESRSEDRATE 42* 12/28/2012 1558     ASSESSMENT / PLAN: Acute on Chronic pulmonary infiltrates w/ resultant dyspnea -work up to date negative ANCA and hypersensitivity panel.  -Diff dx: IPF flare, infection, heart failure. Has responded clinically to steroids in past, ESR elevated but not alarmingly high. PCT was neg. BNP not impressive  - started prednisone 40 mg bid 2/20  Rec: Cont abx empirically  F/u viral screen  Use of PPI is associated with improved survival time and with decreased radiologic fibrosis per King's study published in AJRCCM vol 184 p1390.  Dec 2011   Discussed with husband and also with Dr Maple Hudson.    Sandrea Hughs, MD Pulmonary and Critical Care Medicine [facility] Cell [phone] After 5:30 PM or  weekends, call [phone]

## 2012-12-29 NOTE — Consult Note (Signed)
PULMONARY  / CRITICAL CARE MEDICINE  Name: Brandi [name] MRN: [id number] DOB: 09-26-1933    ADMISSION DATE:  12/27/2012 CONSULTATION DATE:  2/20  REFERRING MD :  vann  CHIEF COMPLAINT:  pulm fibrosis   BRIEF PATIENT DESCRIPTION:    79 y.o. female With h/o ILD not characterized by lung biopsy, ANCA and HS panel negative. 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  Presented to the Ed 2/19 w/ CC: SOB which has been progressively getting worse for 7 days. Patient has chronic cough which has been worse and at the end of January was on antibiotics for CAP. Reportedly patient has developed worsening SOB with exertion. The problem is persistent and has improved w/ oxygen.  PCCM  asked 2/20 to see to further comment on potential course of therapy.    SIGNIFICANT EVENTS / STUDIES:  CT chest 2/19: 1. No evidence for pulmonary embolus. 2. Progression of interstitial lung disease. This is most  compatible with UIP. The peripheral opacities suggest the possibility of a second process such as the organizing pneumonia.  CULTURES: BC 2/19>>> UC 2/19>>> u strep 2/19: neg u legionella 2/19>>> ebv 2/20>>> rsv 2/20>>> cmv 2/20>>> ANTIBIOTICS: 2/19 azithro >>> 2/19 rocephin >>>    SUBJECTIVE:  nad at rest on 02  VITAL SIGNS: Temp:  [98.1 F (36.7 C)-99.4 F (37.4 C)] 98.3 F (36.8 C) (02/21 0600) Pulse Rate:  [89-104] 89 (02/21 0600) Resp:  [18] 18 (02/21 0600) BP: (122-147)/(74-90) 122/74 mmHg (02/21 0600) SpO2:  [96 %-98 %] 98 % (02/21 0600) 2 liters   PHYSICAL EXAMINATION: General:  Awake, no acute distress  Neuro:  oriented no focal def  HEENT:  [place], no JVD  Cardiovascular:  Irregular irregular  Lungs:  Dry elastic crackles  Abdomen:  Soft, non-tender  Musculoskeletal: intact  Skin: intact    Recent Labs Lab 12/27/12 1650 12/28/12 0500 12/29/12 0455  NA 135 139 139  K 3.5 3.8 4.2  CL 99 105 101  CO2 27 27  27   BUN 11 10 12   CREATININE 0.66 0.63 0.65  GLUCOSE 90 89 93    Recent Labs Lab 12/27/12 1650 12/28/12 0500 12/29/12 0455  HGB 12.0 11.8* 12.9  HCT 36.8 36.3 39.2  WBC 7.9 6.4 8.4  PLT 326 293 301   BNP    Component Value Date/Time   PROBNP 241.4 12/28/2012 1559    Recent Labs Lab 12/27/12 1650 12/28/12 0500 12/28/12 1558 12/29/12 0455  PROCALCITON  --   --  <0.10  --   WBC 7.9 6.4  --  8.4   Erythrocyte Sedimentation Rate     Component Value Date/Time   ESRSEDRATE 42* 12/28/2012 1558     ASSESSMENT / PLAN: Acute on Chronic pulmonary infiltrates w/ resultant dyspnea -work up to date negative ANCA and hypersensitivity panel.  -Diff dx: IPF flare, infection, heart failure. Has responded clinically to steroids in past, ESR elevated but not alarmingly high. PCT was neg. BNP not impressive  Rec: Cont abx empirically  F/u viral screen  Use of PPI is associated with improved survival time and with decreased radiologic fibrosis per King's study published in AJRCCM vol 184 p1390.  Dec 2011  - started prednisone 40 mg bid 2/20  Discussed with husband and also with Dr Maple Hudson.    Sandrea Hughs, MD Pulmonary and Critical Care Medicine [facility] Cell [phone] After 5:30 PM or weekends, call [phone]

## 2012-12-29 NOTE — Evaluation (Signed)
Physical Therapy Evaluation Patient Details Name: Brandi [name] MRN: [id number] DOB: [birth year] Today's Date: 12/29/2012 Time: 2130-8657 PT Time Calculation (min): 23 min  PT Assessment / Plan / Recommendation Clinical Impression  Pt presents with CAP and history of hydocephalus s/p stent placement (x2).  Tolerated ambulation in hallway with RW at min assist, however noted increased WOB with increased ambulation.  Ambulated on 2L O2 with SaO2 in mid to upper 90's throughout.  Pt encouraged to ambulate with RN staff over weekend if PT does not see her.  Pt will benefit from skilled PT in acute venue to address deficits.  PT recommends HHPT for follow up at D/C to maximize pts safety and function.     PT Assessment  Patient needs continued PT services    Follow Up Recommendations  Home health PT;Supervision/Assistance - 24 hour    Does the patient have the potential to tolerate intense rehabilitation      Barriers to Discharge None      Equipment Recommendations  None recommended by PT    Recommendations for Other Services     Frequency Min 3X/week    Precautions / Restrictions Precautions Precautions: Fall Precaution Comments: monitor O2, no vestibular input on L side (has been through vestibular PT) and is deaf in left ear.  Restrictions Weight Bearing Restrictions: No   Pertinent Vitals/Pain No pain stated      Mobility  Bed Mobility Bed Mobility: Not assessed Details for Bed Mobility Assistance: Pt in recliner when PT arrived.  Transfers Transfers: Sit to Stand;Stand to Sit Sit to Stand: 4: Min assist;With upper extremity assist;With armrests;From chair/3-in-1 Stand to Sit: 4: Min assist;With upper extremity assist;With armrests;To chair/3-in-1 Details for Transfer Assistance: Assist to rise and steady with cues for hand placement and safety.  Ambulation/Gait Ambulation/Gait Assistance: 4: Min assist Ambulation Distance (Feet): 74 Feet Assistive device: Rolling  walker Ambulation/Gait Assistance Details: Cues for continued pursed lip breathing throughout as pt would increase ambulation, noted WOB would increase and she would take shorter shallow breaths.  Ambulated on 2L O2 with SaO2 at 95% halfway through amb and 96% upon returning to room.  HR up to 123 during amb.  Gait Pattern: Step-through pattern;Decreased stride length;Trunk flexed Gait velocity: decreased Stairs: No Wheelchair Mobility Wheelchair Mobility: No    Exercises General Exercises - Lower Extremity Ankle Circles/Pumps: AROM;Both;10 reps Straight Leg Raises: Strengthening;Both;10 reps   PT Diagnosis: Difficulty walking;Generalized weakness  PT Problem List: Decreased strength;Decreased activity tolerance;Decreased balance;Decreased mobility;Cardiopulmonary status limiting activity PT Treatment Interventions: DME instruction;Gait training;Stair training;Functional mobility training;Therapeutic activities;Therapeutic exercise;Balance training;Patient/family education   PT Goals Acute Rehab PT Goals PT Goal Formulation: With patient/family Time For Goal Achievement: 01/05/13 Potential to Achieve Goals: Good Pt will go Supine/Side to Sit: with supervision PT Goal: Supine/Side to Sit - Progress: Goal set today Pt will go Sit to Supine/Side: with supervision PT Goal: Sit to Supine/Side - Progress: Goal set today Pt will go Sit to Stand: with supervision PT Goal: Sit to Stand - Progress: Goal set today Pt will Ambulate: >150 feet;with supervision;with least restrictive assistive device PT Goal: Ambulate - Progress: Goal set today Pt will Go Up / Down Stairs: 1-2 stairs;with min assist;with least restrictive assistive device PT Goal: Up/Down Stairs - Progress: Goal set today Pt will Perform Home Exercise Program: with supervision, verbal cues required/provided PT Goal: Perform Home Exercise Program - Progress: Goal set today  Visit Information  Last PT Received On:  12/29/12 Assistance Needed: +2 (+2 helpful for  O2)    Subjective Data  Subjective: I want to get up.  Patient Stated Goal: to return home.    Prior Functioning  Home Living Lives With: Spouse;Daughter Available Help at Discharge: Family;Available 24 hours/day Type of Home: House Home Access: Stairs to enter [employer] of Steps: 1 Entrance Stairs-Rails: Right Home Layout: One level Bathroom Shower/Tub: Engineer, manufacturing systems: Standard Home Adaptive Equipment: Bedside commode/3-in-1;Tub transfer bench;Walker - four wheeled;Grab bars in shower Prior Function Level of Independence: Independent with assistive device(s) Able to Take Stairs?: Yes Driving: No Vocation: Retired Musician: Surveyor, mining Overall Cognitive Status: Appears within functional limits for tasks assessed/performed Arousal/Alertness: Awake/alert Orientation Level: Appears intact for tasks assessed Behavior During Session: [facility] for tasks performed    Extremity/Trunk Assessment Right Lower Extremity Assessment RLE ROM/Strength/Tone: [facility] for tasks assessed Left Lower Extremity Assessment LLE ROM/Strength/Tone: [facility] for tasks assessed Trunk Assessment Trunk Assessment: Kyphotic   Balance    End of Session PT - End of Session Equipment Utilized During Treatment: Gait belt;Oxygen Activity Tolerance: Patient limited by fatigue Patient left: in chair;with call bell/phone within reach;with family/visitor present Nurse Communication: Mobility status  GP     Vista Deck 12/29/2012, 4:04 PM

## 2012-12-30 DIAGNOSIS — J42 Unspecified chronic bronchitis: Secondary | ICD-10-CM

## 2012-12-30 LAB — PROCALCITONIN: Procalcitonin: <0.1 ng/mL

## 2012-12-30 NOTE — Progress Notes (Signed)
[facility] PROGRESS NOTE  Brandi [name] ZOX:[id number] DOB: 09/14/1933 DOA: 12/27/2012 PCP: Brandi Bradford, MD  Assessment/Plan Hypoxia - Most likely due to underlying lung disease presumed to be interstitial lung disease  - appreciate pulm: PPI added, steroid BID  CAP  - treat with Azithromycin and Rocephin (Avoiding avelox as pt reports h/o hallucinations on this medication)  - Patient received IV antibiotic prior to order for blood cultures therefore will not obtain blood culture. Pt is non toxic appearing at this juncture  - routine labs including sputum culture, urine legionella/strep antigen- negative - supplemental oxygen as needed- may very need O2 at home -viral panel pending  Presumed interstitial lung disease  - continue home regimen  - steroid BID   H/o Hydrocephalus  - discussed with Neurosurgeon Dr. Venetia [name] who has looked over CT of head and indicated that currently the hydrocephalus when compared to last shunt does not look impressive and does not need intervention at this juncture  - Patient has an appointment for this Friday as outpatient   DVT prophylaxis  - heparin   Code Status: full Family Communication: patient at bedside Disposition Plan: home 1-2 days once work up done   Consultants:    Procedures:    Antibiotics:  Rocephin/azithro  HPI/Subjective: Breathing better today Walked with PT yesterday  Objective: Filed Vitals:   12/29/12 0600 12/29/12 1453 12/29/12 2206 12/30/12 0600  BP: 122/74 148/82 144/83 145/80  Pulse: 89 111 96 89  Temp: 98.3 F (36.8 C) 97.9 F (36.6 C) 98 F (36.7 C) 98.4 F (36.9 C)  TempSrc: Oral Oral Oral Oral  Resp: 18 19 20 18   Height:      Weight:      SpO2: 98% 96%  98%    Intake/Output Summary (Last 24 hours) at 12/30/12 1002 Last data filed at 12/29/12 1423  Gross per 24 hour  Intake      0 ml  Output      2 ml  Net     -2 ml   Filed Weights   12/28/12 0500  Weight: 61.508 kg (135  lb 9.6 oz)    Exam:   General:  A+Ox3, NAD- some breathlessness with talking  Cardiovascular: rrr  Respiratory: dec BS, no wheezing, no crackles  Abdomen: +BS, soft, NT/ND  Data Reviewed: Basic Metabolic Panel:  Recent Labs Lab 12/27/12 1650 12/28/12 0500 12/29/12 0455  NA 135 139 139  K 3.5 3.8 4.2  CL 99 105 101  CO2 27 27 27   GLUCOSE 90 89 93  BUN 11 10 12   CREATININE 0.66 0.63 0.65  CALCIUM 8.5 8.4 9.2   Liver Function Tests: No results found for this basename: AST, ALT, ALKPHOS, BILITOT, PROT, ALBUMIN,  in the last 168 hours No results found for this basename: LIPASE, AMYLASE,  in the last 168 hours No results found for this basename: AMMONIA,  in the last 168 hours CBC:  Recent Labs Lab 12/27/12 1650 12/28/12 0500 12/29/12 0455  WBC 7.9 6.4 8.4  NEUTROABS 5.0  --   --   HGB 12.0 11.8* 12.9  HCT 36.8 36.3 39.2  MCV 90.0 91.2 91.6  PLT 326 293 301   Cardiac Enzymes:  Recent Labs Lab 12/27/12 1650  TROPONINI <0.30   BNP (last 3 results)  Recent Labs  12/28/12 1559  PROBNP 241.4   CBG: No results found for this basename: GLUCAP,  in the last 168 hours  Recent Results (from the past 240 hour(s))  URINE  CULTURE     Status: None   Collection Time    12/27/12  6:00 PM      Result Value Range Status   Specimen Description URINE, CLEAN CATCH   Final   Special Requests NONE   Final   Culture  Setup Time 12/28/2012 03:09   Final   Colony Count 40,000 COLONIES/ML   Final   Culture     Final   Value: Multiple bacterial morphotypes present, none predominant. Suggest appropriate recollection if clinically indicated.   Report Status 12/28/2012 FINAL   Final     Studies: Dg Chest Port 1 View  12/28/2012  *RADIOLOGY REPORT*  Clinical Data: Shortness of breath and cough.  PORTABLE CHEST - 1 VIEW  Comparison: CT scan 12/27/2012.  Plain films of the chest 12/27/2012 and 06/26/2012.  Findings: Extensive bilateral airspace disease and changes of  pulmonary fibrosis do not appear changed since the most recent plain films.  No pneumothorax or pleural fluid is seen.  Heart size is normal.  Scoliosis is noted.  IMPRESSION: No change in pulmonary fibrosis and possible superimposed pneumonia.   Original Report Authenticated By: Brandi [name], M.D.     Scheduled Meds: . acetaminophen  1,000 mg Oral TID  . amLODipine  5 mg Oral QHS  . azithromycin  500 mg Oral Q24H  . buPROPion  100 mg Oral q morning - 10a  . cefTRIAXone (ROCEPHIN)  IV  1 g Intravenous Q24H  . donepezil  10 mg Oral QHS  . famotidine  20 mg Oral QHS  . heparin  5,000 Units Subcutaneous Q8H  . mometasone-formoterol  2 puff Inhalation BID  . montelukast  10 mg Oral QPM  . pantoprazole  40 mg Oral BID AC  . predniSONE  40 mg Oral BID WC  . sertraline  100 mg Oral q morning - 10a  . sodium chloride  3 mL Intravenous Q12H   Continuous Infusions:    Principal Problem:   CAP (community acquired pneumonia) Active Problems:   ASTHMA   Interstitial lung disease   Hydrocephalus   Hypoxia    Time spent: 35    Brandi [name], Brandi [name]  [facility] Pager [phone]. If 8PM-8AM, please contact night-coverage at [email or web address], password [name] 12/30/2012, 10:02 AM  LOS: 3 days

## 2012-12-30 NOTE — Progress Notes (Signed)
PULMONARY  / CRITICAL CARE MEDICINE  Name: Brandi [name] MRN: [id number] DOB: 1933/01/28    ADMISSION DATE:  12/27/2012 CONSULTATION DATE:  2/20  REFERRING MD :  vann  CHIEF COMPLAINT:  pulm fibrosis   BRIEF PATIENT DESCRIPTION:    79 y.o. female With h/o ILD not characterized by lung biopsy, ANCA and HS panel negative. 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  Presented to the Ed 2/19 w/ CC: SOB which has been progressively getting worse for 7 days. Patient has chronic cough which has been worse and at the end of January was on antibiotics for CAP. Reportedly patient has developed worsening SOB with exertion. The problem is persistent and has improved w/ oxygen.  PCCM  asked 2/20 to see to further comment on potential course of therapy.    SIGNIFICANT EVENTS / STUDIES:  CT chest 2/19: 1. No evidence for pulmonary embolus. 2. Progression of interstitial lung disease. This is most  compatible with UIP. The peripheral opacities suggest the possibility of a second process such as the organizing pneumonia.  CULTURES: BC 2/19>>> UC 2/19>>> u strep 2/19: neg u legionella 2/19> neg ebv 2/20>>> rsv 2/20>>> cmv 2/20>>>  ANTIBIOTICS: 2/19 azithro >>> 2/19 rocephin >>>    SUBJECTIVE:  Husband and son here. She is feeling better with steroids and O2. We discussed biopsy- she is reluctant.  VITAL SIGNS: Temp:  [97.9 F (36.6 C)-98.4 F (36.9 C)] 98.4 F (36.9 C) (02/22 0600) Pulse Rate:  [89-111] 89 (02/22 0600) Resp:  [18-20] 18 (02/22 0600) BP: (144-148)/(80-83) 145/80 mmHg (02/22 0600) SpO2:  [96 %-98 %] 97 % (02/22 1049) 02 rx  2 liters   PHYSICAL EXAMINATION: General:  Awake, no acute distress  Neuro:  oriented no focal def  HEENT:  [place], no JVD. Hard of hearing. Cardiovascular:  Irregular irregular  Lungs:  Dry elastic crackles. Scoliosis  Abdomen:  Soft, non-tender  Musculoskeletal: intact  Skin: intact     Recent Labs Lab 12/27/12 1650 12/28/12 0500 12/29/12 0455  NA 135 139 139  K 3.5 3.8 4.2  CL 99 105 101  CO2 27 27 27   BUN 11 10 12   CREATININE 0.66 0.63 0.65  GLUCOSE 90 89 93    Recent Labs Lab 12/27/12 1650 12/28/12 0500 12/29/12 0455  HGB 12.0 11.8* 12.9  HCT 36.8 36.3 39.2  WBC 7.9 6.4 8.4  PLT 326 293 301   BNP    Component Value Date/Time   PROBNP 241.4 12/28/2012 1559    Recent Labs Lab 12/27/12 1650 12/28/12 0500 12/28/12 1558 12/29/12 0455 12/30/12 0516  PROCALCITON  --   --  <0.10  --  <0.10  WBC 7.9 6.4  --  8.4  --    Erythrocyte Sedimentation Rate     Component Value Date/Time   ESRSEDRATE 42* 12/28/2012 1558   I reviewed latest CT and meds.  ASSESSMENT / PLAN: Acute on Chronic pulmonary infiltrates w/ resultant dyspnea -work up to date negative ANCA and hypersensitivity panel. These make Eosinophilic pneumonia and Hypersensitivity Pneumonia less likely. -Diff dx: IPF flare, infection, heart failure. Has responded clinically to steroids in past, ESR elevated but not alarmingly high. PCT was neg. BNP not impressive -Significant bronchiectasis and ground glass changes noted on CT. Prognosis over next 2-3 years is very poor. VATS lung bx would be of interest, but unlikely to change therapy. She is not interested.  - started prednisone 40  mg bid 2/20  Rec: Cont abx empirically  F/u viral screen  Use of PPI is associated with improved survival time and with decreased radiologic fibrosis per King's study published in AJRCCM vol 184 p1390.  Dec 2011 Encourage mobilization. Home soon on oral steroids 40 mg daily x 1 month or next pulmonary office f/u. Home O2 if she qualifies at discharge, at least for sleep.   Discussed with husband and son  CD Maple Hudson, MD PCCM Mobile [phone] After 5:30 PM or weekends, call [phone]

## 2012-12-31 NOTE — Progress Notes (Signed)
PULMONARY  / CRITICAL CARE MEDICINE  Name: JAZARAH CAPILI MRN: [id number] DOB: 1933-02-04    ADMISSION DATE:  12/27/2012 CONSULTATION DATE:  2/20  REFERRING MD :  vann  CHIEF COMPLAINT:  pulm fibrosis   BRIEF PATIENT DESCRIPTION:    79 y.o. female With h/o ILD not characterized by lung biopsy, ANCA and HS panel negative. 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  Presented to the Ed 2/19 w/ CC: SOB which has been progressively getting worse for 7 days. Patient has chronic cough which has been worse and at the end of January was on antibiotics for CAP. Reportedly patient has developed worsening SOB with exertion. The problem is persistent and has improved w/ oxygen.  PCCM  asked 2/20 to see to further comment on potential course of therapy.    SIGNIFICANT EVENTS / STUDIES:  CT chest 2/19: 1. No evidence for pulmonary embolus. 2. Progression of interstitial lung disease. This is most  compatible with UIP. The peripheral opacities suggest the possibility of a second process such as the organizing pneumonia.  CULTURES: BC 2/19>>> UC 2/19>>> u strep 2/19: neg u legionella 2/19> neg ebv 2/20>>> rsv 2/20>>> cmv 2/20>>>  ANTIBIOTICS: 2/19 azithro >>> 2/19 rocephin >>>    SUBJECTIVE:  Son here. She is feeling better with steroids and O2. Off antibiotics. Now on prednisone 40 mg daily.  VITAL SIGNS: Temp:  [97.7 F (36.5 C)-98.4 F (36.9 C)] 97.9 F (36.6 C) (02/23 0600) Pulse Rate:  [91-110] 91 (02/23 0600) Resp:  [18] 18 (02/23 0600) BP: (134-154)/(83-90) 154/90 mmHg (02/23 0600) SpO2:  [96 %-100 %] 99 % (02/23 0925) 02 rx  2 liters   PHYSICAL EXAMINATION: General:  Awake, appears comfortable, back in bed Neuro:  oriented no focal def  HEENT:  [place], no JVD. Hard of hearing. Cardiovascular:  Almost regular, no murmur Lungs:  Dry crackles. Scoliosis, unlabored Abdomen:  Soft, non-tender  Musculoskeletal: intact   Skin: intact    Recent Labs Lab 12/27/12 1650 12/28/12 0500 12/29/12 0455  NA 135 139 139  K 3.5 3.8 4.2  CL 99 105 101  CO2 27 27 27   BUN 11 10 12   CREATININE 0.66 0.63 0.65  GLUCOSE 90 89 93    Recent Labs Lab 12/27/12 1650 12/28/12 0500 12/29/12 0455  HGB 12.0 11.8* 12.9  HCT 36.8 36.3 39.2  WBC 7.9 6.4 8.4  PLT 326 293 301   BNP    Component Value Date/Time   PROBNP 241.4 12/28/2012 1559    Recent Labs Lab 12/27/12 1650 12/28/12 0500 12/28/12 1558 12/29/12 0455 12/30/12 0516  PROCALCITON  --   --  <0.10  --  <0.10  WBC 7.9 6.4  --  8.4  --    Erythrocyte Sedimentation Rate     Component Value Date/Time   ESRSEDRATE 42* 12/28/2012 1558   I reviewed latest CT and meds.  ASSESSMENT / PLAN: Acute on Chronic pulmonary infiltrates w/ resultant dyspnea -work up to date negative ANCA and hypersensitivity panel. These make Eosinophilic pneumonia and Hypersensitivity Pneumonia less likely. -Diff dx: IPF flare, infection, heart failure. Has responded clinically to steroids in past, ESR elevated but not alarmingly high. PCT was neg. BNP not impressive -Significant bronchiectasis and ground glass changes noted on CT. Prognosis over next 2-3 years is very poor. VATS lung bx would be of interest, but unlikely to change therapy. She is not interested.  - started prednisone  40 mg bid 2/20  Rec: F/u viral screen  Use of PPI is associated with improved survival time and with decreased radiologic fibrosis per King's study published in AJRCCM vol 184 p1390.  Dec 2011 Encourage mobilization.  - I think she can go home when you are ready, on prednisone 40 mg daily and home O2  40 mg daily x 1 month or next - pulmonary office f/u. Home O2 if she qualifies at discharge, at least for sleep.     Terisa Starr, MD PCCM Mobile [phone] After 5:30 PM or weekends, call [phone]

## 2012-12-31 NOTE — Progress Notes (Signed)
[facility] PROGRESS NOTE  Brandi [name] RUE:[id number] DOB: November 07, 1933 DOA: 12/27/2012 PCP: Cala Bradford, MD  Assessment/Plan Hypoxia - Most likely due to underlying lung disease presumed to be interstitial lung disease  - appreciate pulm: PPI added, steroid BID  CAP  - treated with Azithromycin and Rocephin (Avoiding avelox as pt reports h/o hallucinations on this medication) - pro-calcitonin negative - Patient received IV antibiotic prior to order for blood cultures therefore will not obtain blood culture. Pt is non toxic appearing at this juncture  - routine labs including sputum culture, urine legionella/strep antigen- negative - supplemental oxygen as needed- may very need O2 at home -viral panel  Presumed interstitial lung disease  - continue home regimen  - steroid daily  H/o Hydrocephalus  - discussed with Neurosurgeon Dr. Venetia Maxon who has looked over CT of head and indicated that currently the hydrocephalus when compared to last shunt does not look impressive and does not need intervention at this juncture  - Patient has an appointment for this Friday as outpatient   DVT prophylaxis  - heparin   Code Status: full Family Communication: patient at bedside Disposition Plan: home in AM   Consultants:    Procedures:    Antibiotics:  Rocephin/azithro  HPI/Subjective: Breathing better today Coughing spells when speaking a lot  Objective: Filed Vitals:   12/30/12 2025 12/30/12 2200 12/31/12 0600 12/31/12 0925  BP:  142/83 154/90   Pulse:  97 91   Temp:  97.7 F (36.5 C) 97.9 F (36.6 C)   TempSrc:  Oral Oral   Resp:  18 18   Height:      Weight:      SpO2: 96% 98% 100% 99%    Intake/Output Summary (Last 24 hours) at 12/31/12 1121 Last data filed at 12/30/12 2300  Gross per 24 hour  Intake     60 ml  Output      0 ml  Net     60 ml   Filed Weights   12/28/12 0500  Weight: 61.508 kg (135 lb 9.6 oz)    Exam:   General:  A+Ox3,  NAD- some breathlessness with talking  Cardiovascular: rrr  Respiratory: dec BS, no wheezing, no crackles  Abdomen: +BS, soft, NT/ND  Data Reviewed: Basic Metabolic Panel:  Recent Labs Lab 12/27/12 1650 12/28/12 0500 12/29/12 0455  NA 135 139 139  K 3.5 3.8 4.2  CL 99 105 101  CO2 27 27 27   GLUCOSE 90 89 93  BUN 11 10 12   CREATININE 0.66 0.63 0.65  CALCIUM 8.5 8.4 9.2   Liver Function Tests: No results found for this basename: AST, ALT, ALKPHOS, BILITOT, PROT, ALBUMIN,  in the last 168 hours No results found for this basename: LIPASE, AMYLASE,  in the last 168 hours No results found for this basename: AMMONIA,  in the last 168 hours CBC:  Recent Labs Lab 12/27/12 1650 12/28/12 0500 12/29/12 0455  WBC 7.9 6.4 8.4  NEUTROABS 5.0  --   --   HGB 12.0 11.8* 12.9  HCT 36.8 36.3 39.2  MCV 90.0 91.2 91.6  PLT 326 293 301   Cardiac Enzymes:  Recent Labs Lab 12/27/12 1650  TROPONINI <0.30   BNP (last 3 results)  Recent Labs  12/28/12 1559  PROBNP 241.4   CBG: No results found for this basename: GLUCAP,  in the last 168 hours  Recent Results (from the past 240 hour(s))  URINE CULTURE     Status: None  Collection Time    12/27/12  6:00 PM      Result Value Range Status   Specimen Description URINE, CLEAN CATCH   Final   Special Requests NONE   Final   Culture  Setup Time 12/28/2012 03:09   Final   Colony Count 40,000 COLONIES/ML   Final   Culture     Final   Value: Multiple bacterial morphotypes present, none predominant. Suggest appropriate recollection if clinically indicated.   Report Status 12/28/2012 FINAL   Final     Studies: No results found.  Scheduled Meds: . acetaminophen  1,000 mg Oral TID  . amLODipine  5 mg Oral QHS  . buPROPion  100 mg Oral q morning - 10a  . donepezil  10 mg Oral QHS  . famotidine  20 mg Oral QHS  . heparin  5,000 Units Subcutaneous Q8H  . mometasone-formoterol  2 puff Inhalation BID  . montelukast  10 mg Oral  QPM  . pantoprazole  40 mg Oral BID AC  . predniSONE  40 mg Oral BID WC  . sertraline  100 mg Oral q morning - 10a  . sodium chloride  3 mL Intravenous Q12H   Continuous Infusions:    Principal Problem:   CAP (community acquired pneumonia) Active Problems:   ASTHMA   Interstitial lung disease   Hydrocephalus   Hypoxia    Time spent: 35    [facility], Juda Lajeunesse  [facility] Pager [phone]. If 8PM-8AM, please contact night-coverage at [email or web address], password [email or web address] 12/31/2012, 11:21 AM  LOS: 4 days

## 2012-12-31 NOTE — Progress Notes (Signed)
SATURATION QUALIFICATIONS: (This note is used to comply with regulatory documentation for home oxygen)  Patient Saturations on Room Air at Rest =  88%   Patient Saturations on Room Air while Ambulating = 82%  Patient saturations on O2 at 2 liters [place] at rest 96%    Please briefly explain why patient needs home oxygen:

## 2013-01-01 LAB — PROCALCITONIN: Procalcitonin: <0.1 ng/mL

## 2013-01-01 MED ORDER — AMOXICILLIN-POT CLAVULANATE 875-125 MG PO TABS
1.0000 | ORAL_TABLET | Freq: Two times a day (BID) | ORAL | Status: DC
  Administered 2013-01-01: 1 via ORAL
  Filled 2013-01-01 (×2): qty 1

## 2013-01-01 MED ORDER — PANTOPRAZOLE SODIUM 40 MG PO TBEC: 40.0000 mg | DELAYED_RELEASE_TABLET | Freq: Two times a day (BID) | ORAL | Status: DC

## 2013-01-01 MED ORDER — AMOXICILLIN-POT CLAVULANATE 875-125 MG PO TABS: 1.0000 | ORAL_TABLET | Freq: Two times a day (BID) | ORAL | Status: DC

## 2013-01-01 MED ORDER — PREDNISONE 20 MG PO TABS: 40.0000 mg | ORAL_TABLET | Freq: Every day | ORAL | Status: DC

## 2013-01-01 MED ORDER — FAMOTIDINE 20 MG PO TABS: 20.0000 mg | ORAL_TABLET | Freq: Every day | ORAL | Status: DC

## 2013-01-01 NOTE — Discharge Summary (Signed)
Physician Discharge Summary  Brandi [name] WGN:[id number] DOB: 07-03-1933 DOA: 12/27/2012  PCP: Cala Bradford, MD  Admit date: 12/27/2012 Discharge date: 01/01/2013  Time spent: 35 minutes  Recommendations for Outpatient Follow-up:  1. O2 at home 2. Continue steroids daily until seen by Pulm (keep appt)  Discharge Diagnoses:  Principal Problem:   CAP (community acquired pneumonia) Active Problems:   ASTHMA   Interstitial lung disease   Hydrocephalus   Hypoxia   Discharge Condition: improved  Diet recommendation: cardiac/diabetic  Filed Weights   12/28/12 0500  Weight: 61.508 kg (135 lb 9.6 oz)    History of present illness:  Brandi [name] is a 79 y.o. 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 Presented to the Ed 2ary to SOB which has been progressively getting worse for the last 10 days. Patient has been having cough for > 10 days and at the end of January was on antibiotics for CAP. Reportedly patient has developed worsening SOB with exertion. The problem is persistent and has not improved. Nothing makes it better reportedly. Not associated with hemoptysis. Patient reports that supplemental oxygen has helped.  While in the ED Patient had CT of head due to complaints of incontinence and difficulty walking. CT of head showed a slight interval enlargement of the ventricles may suggest a shunt malfunction. No findings for acute infarction and/or intracranial hemorrhage.  Also had CT angio of chest which was negative for PE but suggested possibility of organizing pna. WBC was WNL and patient was afebrile with reported desaturation while on room air of 80's   Hospital Course:  Hypoxia  - Most likely due to underlying lung disease presumed to be interstitial lung disease  - appreciate pulm: PPI added, steroid daily   CAP  - treated  with Azithromycin and Rocephin (Avoiding avelox as pt reports h/o hallucinations on this medication) - pro-calcitonin negative- will send on augmentin - Patient received IV antibiotic prior to order for blood cultures therefore will not obtain blood culture. Pt is non toxic appearing at this juncture  - routine labs including sputum culture, urine legionella/strep antigen- negative  - supplemental oxygen  -viral panel   Presumed interstitial lung disease  - continue home regimen  - steroid daily   H/o Hydrocephalus  - discussed with Neurosurgeon Dr. Venetia Maxon who has looked over CT of head and indicated that currently the hydrocephalus when compared to last shunt does not look impressive and does not need intervention at this juncture  - follow up with neurosurgeon in [place]   Procedures:  none  Consultations:  pulm  Discharge Exam: Filed Vitals:   12/31/12 2107 12/31/12 2200 01/01/13 0619 01/01/13 0857  BP:  140/78 160/97   Pulse:  93 100   Temp:  97.9 F (36.6 C) 97.9 F (36.6 C)   TempSrc:  Oral Oral   Resp:  18 18   Height:      Weight:      SpO2: 90% 97% 96% 96%    General: A+Ox3, NAD Cardiovascular: rrr Respiratory: clear anterior  Discharge Instructions      Discharge Orders   Future Appointments Provider Department Dept Phone   01/22/2013 2:30 PM [facility] 6 Minute Walk [facility] [phone]   01/22/2013 3:15 PM Waymon Budge, MD [facility] [phone]   Future Orders Complete By Expires     Diet -  low sodium heart healthy  As directed     Discharge instructions  As directed     Comments:      Home health O2 2L 24/7    Increase activity slowly  As directed         Medication List    TAKE these medications       acetaminophen 500 MG tablet  Commonly known as:  TYLENOL  Take 1,000 mg by mouth 3 (three) times daily.     amLODipine 5 MG tablet  Commonly known as:  NORVASC  Take 1 tablet by mouth every evening.      amoxicillin-clavulanate 875-125 MG per tablet  Commonly known as:  AUGMENTIN  Take 1 tablet by mouth every 12 (twelve) hours.     Azelastine-Fluticasone 137-50 MCG/ACT Susp  Commonly known as:  DYMISTA  Place 2 sprays into both nostrils at bedtime.     buPROPion 100 MG tablet  Commonly known as:  WELLBUTRIN  Take 100 mg by mouth every morning.     donepezil 10 MG tablet  Commonly known as:  ARICEPT  Take 10 mg by mouth every evening.     ergocalciferol 50000 UNITS capsule  Commonly known as:  VITAMIN D2  Take 50,000 Units by mouth once a week. sundays     famotidine 20 MG tablet  Commonly known as:  PEPCID  Take 1 tablet (20 mg total) by mouth at bedtime.     Fluticasone-Salmeterol 250-50 MCG/DOSE Aepb  Commonly known as:  ADVAIR  Inhale 1 puff into the lungs every 12 (twelve) hours.     montelukast 10 MG tablet  Commonly known as:  SINGULAIR  Take 10 mg by mouth every evening.     pantoprazole 40 MG tablet  Commonly known as:  PROTONIX  Take 1 tablet (40 mg total) by mouth 2 (two) times daily before a meal.     predniSONE 20 MG tablet  Commonly known as:  DELTASONE  Take 2 tablets (40 mg total) by mouth daily.     raloxifene 60 MG tablet  Commonly known as:  EVISTA  Take 60 mg by mouth every morning.     sertraline 100 MG tablet  Commonly known as:  ZOLOFT  Take 100 mg by mouth every morning.       Follow-up Information   Follow up with Cala Bradford, MD In 1 week.   Contact information:   [address] [place] [address] [phone]       Follow up with Waymon Budge, MD. (as previously scheduled)    Contact information:   [address]  [facility] [place] [address] [phone]       Follow up with neurosurgeon in [facility] ASAP.       The results of significant diagnostics from this hospitalization (including imaging, microbiology, ancillary and laboratory) are listed below for reference.    Significant  Diagnostic Studies: Dg Chest 2 View  12/27/2012  *RADIOLOGY REPORT*  Clinical Data: Dry cough for 6 weeks  CHEST - 2 VIEW  Comparison: Chest x-ray of 11/30/2012  Findings: Opacities bilaterally are consistent with previously described pulmonary fibrosis.  However there is more opacity in the periphery of the left mid lung and superimposed pneumonia cannot be excluded.  No effusion is seen.  Heart size is stable.  There is a thoracolumbar scoliosis present.  A VP shunt overlies the right chest.  IMPRESSION: Pulmonary fibrosis.  Cannot exclude superimposed pneumonia in the left  mid lung field peripherally.   Original Report Authenticated By: Dwyane Dee, M.D.    Ct Head Wo Contrast  12/27/2012  *RADIOLOGY REPORT*  Clinical Data: Headache.  Evaluate shunt.  CT HEAD WITHOUT CONTRAST  Technique:  Contiguous axial images were obtained from the base of the skull through the vertex without contrast.  Comparison: 09/06/2012.  Findings: The ventricles are slightly larger when compared with the prior examination which could suggest a shunt malfunction.  Stable cerebral atrophy and periventricular white matter disease.  There is a small amount of a stable dense extra-axial material on the right.  This could be chronic pleural thickening.  No extra-axial blood is identified.  Stable small extra-axial fluid collection in the right cerebellar region.  Contrast is noted in the vessels from a recent contrast enhanced CT scan.  The bony structures are unremarkable.  The paranasal sinuses and mastoid air cells are clear.  IMPRESSION:  1.  Slight interval enlargement of the ventricles may suggest a shunt malfunction. 2.  Stable mild to dural thickening on the right side. 3.  No findings for acute infarction and/or intracranial hemorrhage.   Original Report Authenticated By: Rudie Meyer, M.D.    Ct Angio Chest Pe W/cm &/or Wo Cm  12/27/2012  *RADIOLOGY REPORT*  Clinical Data: Shortness of breath.  CT ANGIOGRAPHY CHEST   Technique:  Multidetector CT imaging of the chest using the standard protocol during bolus administration of intravenous contrast. Multiplanar reconstructed images including MIPs were obtained and reviewed to evaluate the vascular anatomy.  Contrast: OMNIPAQUE IOHEXOL 350 MG/ML SOLN  Comparison: Two-view chest x-ray 12/27/2012.  Findings: Pulmonary arterial opacification is satisfactory.  No focal filling defects are present to suggest pulmonary embolus.  The heart size is normal.  No significant mediastinal or axillary adenopathy is present.  There is no significant pleural or pericardial effusion.  Limited imaging of the upper abdomen is unremarkable.  Lung windows demonstrate extensive traction bronchiectasis.  There is progression of height, particularly in the right middle lobe. The peripheral opacities are noted bilaterally.  This all falls in the similar distribution as on the prior study.  IMPRESSION:  1.  No evidence for pulmonary embolus. 2.  Progression of interstitial lung disease.  This is most compatible with UIP.  The peripheral opacities suggest the possibility of a second process such as the organizing pneumonia. 3.  No acute abnormality.   Original Report Authenticated By: Marin Roberts, M.D.    Dg Chest Port 1 View  12/28/2012  *RADIOLOGY REPORT*  Clinical Data: Shortness of breath and cough.  PORTABLE CHEST - 1 VIEW  Comparison: CT scan 12/27/2012.  Plain films of the chest 12/27/2012 and 06/26/2012.  Findings: Extensive bilateral airspace disease and changes of pulmonary fibrosis do not appear changed since the most recent plain films.  No pneumothorax or pleural fluid is seen.  Heart size is normal.  Scoliosis is noted.  IMPRESSION: No change in pulmonary fibrosis and possible superimposed pneumonia.   Original Report Authenticated By: Holley Dexter, M.D.     Microbiology: Recent Results (from the past 240 hour(s))  URINE CULTURE     Status: None   Collection Time     12/27/12  6:00 PM      Result Value Range Status   Specimen Description URINE, CLEAN CATCH   Final   Special Requests NONE   Final   Culture  Setup Time 12/28/2012 03:09   Final   Colony Count 40,000 COLONIES/ML   Final  Culture     Final   Value: Multiple bacterial morphotypes present, none predominant. Suggest appropriate recollection if clinically indicated.   Report Status 12/28/2012 FINAL   Final     Labs: Basic Metabolic Panel:  Recent Labs Lab 12/27/12 1650 12/28/12 0500 12/29/12 0455  NA 135 139 139  K 3.5 3.8 4.2  CL 99 105 101  CO2 27 27 27   GLUCOSE 90 89 93  BUN 11 10 12   CREATININE 0.66 0.63 0.65  CALCIUM 8.5 8.4 9.2   Liver Function Tests: No results found for this basename: AST, ALT, ALKPHOS, BILITOT, PROT, ALBUMIN,  in the last 168 hours No results found for this basename: LIPASE, AMYLASE,  in the last 168 hours No results found for this basename: AMMONIA,  in the last 168 hours CBC:  Recent Labs Lab 12/27/12 1650 12/28/12 0500 12/29/12 0455  WBC 7.9 6.4 8.4  NEUTROABS 5.0  --   --   HGB 12.0 11.8* 12.9  HCT 36.8 36.3 39.2  MCV 90.0 91.2 91.6  PLT 326 293 301   Cardiac Enzymes:  Recent Labs Lab 12/27/12 1650  TROPONINI <0.30   BNP: BNP (last 3 results)  Recent Labs  12/28/12 1559  PROBNP 241.4   CBG: No results found for this basename: GLUCAP,  in the last 168 hours     Signed:  Marlin Canary  [facility] 01/01/2013, 12:33 PM

## 2013-01-01 NOTE — Progress Notes (Signed)
HHPT/RN services set up through [facility] which was pt's choice.

## 2013-01-01 NOTE — Progress Notes (Signed)
Physical Therapy Treatment Patient Details Name: Brandi [name] MRN: [id number] DOB: 09/07/1933 Today's Date: 01/01/2013 Time: 3086-5784 PT Time Calculation (min): 24 min  PT Assessment / Plan / Recommendation Comments on Treatment Session  Pt plans to D/C to home today, has all equipment from prior admissions. Very intellegent lady with good safety awareness.  practiced going up/down one step using B rails.     Follow Up Recommendations  Home health PT;Supervision/Assistance - 24 hour     Does the patient have the potential to tolerate intense rehabilitation     Barriers to Discharge        Equipment Recommendations  None recommended by PT    Recommendations for Other Services    Frequency     Plan      Precautions / Restrictions Precautions Precautions: Fall Precaution Comments: monitor O2, no vestibular input on L side (has been through vestibular PT) and is deaf in left ear.  Restrictions Weight Bearing Restrictions: No   Pertinent Vitals/Pain No c/o pain    Mobility  Bed Mobility Bed Mobility: Not assessed Details for Bed Mobility Assistance: Pt OOB in recliner Transfers Transfers: Sit to Stand;Stand to Sit Sit to Stand: 5: Supervision;From chair/3-in-1 Stand to Sit: 5: Supervision;To chair/3-in-1 Details for Transfer Assistance: increased time, good use of hands to steady self Ambulation/Gait Ambulation/Gait Assistance: 5: Supervision Ambulation Distance (Feet): 82 Feet Assistive device: 4-wheeled walker Ambulation/Gait Assistance Details: amb using her personal cruiser walker on RA sats avg 93%.  Good safety cognition and awarness.  Mild dyspnea noted but aware when she needs to stop and rest. Gait Pattern: Step-through pattern;Decreased stride length;Trunk flexed Gait velocity: decreased Stairs: Yes Stairs Assistance: 4: Min assist Stairs Assistance Details (indicate cue type and reason): min assist only to help lift walker up/down step, other wise pt  performed at Supervision level. Stair Management Technique: Two rails Number of Stairs: 1     PT Goals                                              progressing    Visit Information  Last PT Received On: 01/01/13 Assistance Needed: +1    Subjective Data      Cognition    good   Balance   good  End of Session PT - End of Session Equipment Utilized During Treatment: Gait belt Activity Tolerance: Patient tolerated treatment well Patient left: in chair;with call bell/phone within reach   Felecia Shelling  PTA [facility]  Acute  Rehab Pager      [phone]

## 2013-01-01 NOTE — Progress Notes (Signed)
Patient discharged to home.  Oxygen at bedside to take home with patient.  Home PT and visiting RN set up for patient by case management.  Patient refused My chart.  Iv removed from left wrist.  All belongings with patient.  Patient's husband at bedside to drive patient home.  Patient escorted to lobby via wheelchair.  Patient discharged.

## 2013-01-03 LAB — RSV(RESPIRATORY SYNCYTIAL VIRUS) AB, BLOOD

## 2013-01-09 ENCOUNTER — Telehealth: Payer: Self-pay | Admitting: Internal Medicine

## 2013-01-09 NOTE — Telephone Encounter (Signed)
Please advise on results of ONO thanks

## 2013-01-09 NOTE — Telephone Encounter (Signed)
Pt is aware of results. 

## 2013-01-09 NOTE — Telephone Encounter (Signed)
Her ONOX just came in and reviewed this morning. O2 level held up ok during sleep. She did not need home O2 during sleep.

## 2013-01-10 ENCOUNTER — Telehealth: Payer: Self-pay | Admitting: Internal Medicine

## 2013-01-10 MED ORDER — FLUTICASONE-SALMETEROL 250-50 MCG/DOSE IN AEPB: 1.0000 | INHALATION_SPRAY | Freq: Two times a day (BID) | RESPIRATORY_TRACT | Status: DC

## 2013-01-10 MED ORDER — PREDNISONE 20 MG PO TABS: 40.0000 mg | ORAL_TABLET | Freq: Every day | ORAL | Status: DC

## 2013-01-10 MED ORDER — AZELASTINE-FLUTICASONE 137-50 MCG/ACT NA SUSP: 2.0000 | Freq: Every day | NASAL | Status: DC

## 2013-01-10 NOTE — Telephone Encounter (Signed)
Refills have been sent to the pharmacy per pts request.  Called and spoke with pt and she is aware. Nothing further is needed.

## 2013-01-15 ENCOUNTER — Telehealth: Payer: Self-pay | Admitting: Internal Medicine

## 2013-01-15 ENCOUNTER — Other Ambulatory Visit: Payer: Self-pay | Admitting: Internal Medicine

## 2013-01-15 DIAGNOSIS — J8410 Pulmonary fibrosis, unspecified: Secondary | ICD-10-CM

## 2013-01-15 DIAGNOSIS — R0600 Dyspnea, unspecified: Secondary | ICD-10-CM

## 2013-01-15 NOTE — Telephone Encounter (Signed)
I called and spoke with Brandi [name]. See phone note.

## 2013-01-15 NOTE — Telephone Encounter (Signed)
I spoke with Marisue Humble. She stated pt was recently in the hospital and had tests done. Per Marisue Humble what she was told about the results from the doctor in the hospital is different from what the results are in [place]. She is requesting to speak with Dr. Maple Hudson personally about this. She stated this happened before with pt. Please advise Dr. Maple Hudson thanks

## 2013-01-15 NOTE — Telephone Encounter (Signed)
Daughter-in-law Alfonzo Beers (PA or NP) called with concerns about Lyann' status. Maryela is too weak to travel to her neurosurgeon in [place] now. She has been in hospital since I saw her. Hospitalist had her head CT reviewed by a neurosurgeon with impression that there wasn't a major change in shunt function evident. She had had decline in cognition, incontinence and other problems potentially related to her ventriculocardiac shunt. Apparently that situation is not clearly worse since discharge. I was able to discuss pulmonary status, conversation at last ov about what would be involved in a VATS lung bx, and portable cxr at last hosp suggesting superimposed pneumonia.  Plan- Marisue Humble will contact their Claris Gower neurosurgeon to see if he could help establish Kathrene with a local neurosurgeon who would know her for shunt management here.           They will get Chaniqua here tomorrow for outpatient CXR and labs, so I can try to assess if there is a reversible process, like pneumonia, so we could try to improve oxygenation. Otherwise it may be time to talk more about Hospice.

## 2013-01-16 ENCOUNTER — Ambulatory Visit (INDEPENDENT_AMBULATORY_CARE_PROVIDER_SITE_OTHER)
Admission: RE | Admit: 2013-01-16 | Discharge: 2013-01-16 | Disposition: A | Discharge status: Home / Self Care | Payer: Medicare Other | Source: Ambulatory Visit | Attending: Internal Medicine | Admitting: Internal Medicine

## 2013-01-16 ENCOUNTER — Telehealth: Payer: Self-pay | Admitting: Internal Medicine

## 2013-01-16 DIAGNOSIS — J8410 Pulmonary fibrosis, unspecified: Secondary | ICD-10-CM

## 2013-01-16 NOTE — Telephone Encounter (Signed)
Please verify if the pt will need labs and/or a CXR today before her appointment. Thanks!

## 2013-01-16 NOTE — Telephone Encounter (Signed)
Spoke with Marisue Humble- she is aware that patient will come by for labs/CXR only-not seeing CY today. Marisue Humble verbalized her understanding and wanted to make sure she did not need to have patient checked in on our floor for labs/cxr. Marisue Humble was advised to take patient as able today to lab and cxr-all orders on in the computer.

## 2013-01-18 NOTE — Progress Notes (Signed)
Quick Note:  Pt's daughter in law aware of results. ______

## 2013-01-18 NOTE — Progress Notes (Signed)
Quick Note:  LMTCB for Eley,Maureen-pts caregiver/daughter in Social worker. ______

## 2013-01-22 ENCOUNTER — Encounter: Payer: Self-pay | Admitting: Internal Medicine

## 2013-01-22 ENCOUNTER — Telehealth: Payer: Self-pay | Admitting: Internal Medicine

## 2013-01-22 ENCOUNTER — Ambulatory Visit (INDEPENDENT_AMBULATORY_CARE_PROVIDER_SITE_OTHER): Payer: Medicare Other | Admitting: Internal Medicine

## 2013-01-22 ENCOUNTER — Ambulatory Visit: Payer: Medicare Other

## 2013-01-22 VITALS — BP 108/66 | HR 124 | Ht 68.0 in | Wt 145.6 lb

## 2013-01-22 DIAGNOSIS — J849 Interstitial pulmonary disease, unspecified: Secondary | ICD-10-CM

## 2013-01-22 DIAGNOSIS — G0491 Myelitis, unspecified: Secondary | ICD-10-CM

## 2013-01-22 DIAGNOSIS — G0490 Encephalitis and encephalomyelitis, unspecified: Secondary | ICD-10-CM

## 2013-01-22 DIAGNOSIS — J8410 Pulmonary fibrosis, unspecified: Secondary | ICD-10-CM

## 2013-01-22 MED ORDER — FLUTICASONE PROPIONATE 50 MCG/ACT NA SUSP: 2.0000 | Freq: Every day | NASAL | Status: AC

## 2013-01-22 MED ORDER — FLUTICASONE-SALMETEROL 250-50 MCG/DOSE IN AEPB: 1.0000 | INHALATION_SPRAY | Freq: Two times a day (BID) | RESPIRATORY_TRACT | Status: DC

## 2013-01-22 NOTE — Telephone Encounter (Signed)
Katie,  Please advise if this appt is ok for pt or does he need to be worked in sooner.

## 2013-01-22 NOTE — Progress Notes (Signed)
Patient ID: Brandi [name], female    DOB: 1933/07/10, 79 y.o.   MRN: [id number]  HPI 04/07/11 54 yoF never smoker swith chronic cough, chronic asthmatic bronchitis, significant hx of varicella encephalitis 2004. Husband here. Last here December 04, 2010.  Family member, a PA-C sends a note asking if cough might be from the old nerve inflammation, since it became noticeable about then, and asks about a trial of neurontin.  PFT 03/28/09- wnl. CXR  had looked like COPD with some scarring.  There is question of whether she may have hydrocephalus and this is being evaluated through a neurologist in [place] for potential shunt.  She coughs all day, worst in the morning, but not productive. Failed tessalon and all inhalers. Not dyspneic. Some seasonal postnasal drip. Denies any relation to food or swallowing. Codeine and vicodin give nausea and vomiting.   06/15/11-  [name] never smoker with chronic cough, chronic asthmatic bronchitis, significant hx of varicella encephalitis 2004, Benign normal pressure hydrocephalus.        Husband here. V-P shunt placed in July for hydrocephalus.. Within 2 days her cough had basically stopped and her thinking is much better.  We discussed how to stop Advair and Singulair for observation. CT chest 05/03/11- diffuse patchy peripheral interstitial lung disease. I reviewed the images with them and discussed significance.  11/01/11- [name] never smoker with chronic cough, chronic asthmatic bronchitis, significant hx of varicella encephalitis 2004, Benign normal pressure hydrocephalus/ VP Shunt, ILD Daughter-in-law here. Interstitial lung disease was documented by CT scan in June. Subsequently she had an acute bronchitis with cough leading to a followup chest CT scan December 3 which I do not find in our system. Vertebral compression fracture was identified, secondary to cough, in October. Since then she has done better. Cough is now "not bad", nonproductive or scant clear  phlegm in the morning. Short of breath only proportional to exertion. She stopped Advair. Avelox caused hallucination and confusion. She and family are pleased with her overall status now.  06/26/12- 78 yoF never smoker with chronic cough, chronic asthmatic bronchitis, significant hx of varicella encephalitis 2004, Benign normal pressure hydrocephalus/ VP Shunt, ILD.        Husband here Chest feels comfortable. She does cough productively in the mornings for a while> Clear white mucus, and then again when first lying down. She says she coughs especially if nervous or upset. Mild DOE with more than usual exertion. Since last here she reports multiple CT scans, mostly of her head but including her chest, related to her VP shunt management by neurosurgery in [place]. She is not aware of aspiration events related to her surgery, or of reflux events now at home. CT chest 10/29/11 IMPRESSION:  1. Pulmonary parenchymal pattern of peribronchovascular and  subpleural densities, traction bronchiectasis/bronchiolectasis and  subpleural reticulation, without zonal predominance, consistent  with interstitial lung disease. Differential diagnosis includes  chronic hypersensitivity pneumonitis and chronic eosinophilic  pneumonia.  2. Coronary artery calcification  3. Upper lumbar vertebral body inferior endplate compression  fracture is new from 09/06/2011.  Original Report Authenticated By: Reyes Ivan, M.D.   12/18/12- [name] never smoker with  ILD, chronic cough, chronic asthmatic bronchitis, significant hx of varicella encephalitis 2004, Benign normal pressure hydrocephalus/ VP Shunt,. 2 daughter's here. FOLLOWS ZHY:[id number] SOB since last "fall season" and getting worse; steroid was given by Dr Cliffton Asters and helped with cough as well.  11-29-12 tx'd by Dr Katrinka Blazing for bronchitis(CXR done). Breathing has  not getten better per daughter.  12-01-12 was tx'd  by Dr Jerilee Field abx from Ceftin(made patient sick to  stomach) to Zpak and prednisone. Pt states she started back on Advair and has helped slightly. Hard to breathe deep; sinus drainage and productive cough-cloudy in color and sticky. Cough has been more labored especially in the last 2 months with increased dyspnea on exertion. She blames part of the variability of her status on her neurologic problem/history encephalitis/aunt. Higher dose prednisone helped cough which returned as she tapered dose blow about 40 mg daily. There was a distinct extra exacerbation in January associated with a viral URI syndrome. Daughter's have to help her use Advair technique appropriately. It is helpful when used according to directions but she has some trouble getting inhaled medicines deep enough. CXR- 11/30/12- Findings: Coarse and prominent interstitial markings remain  throughout the lungs right more prominently than left most  consistent with pulmonary fibrosis. No active process is noted.  The heart is mildly enlarged and stable. Thoracolumbar scoliosis  is noted. A VP shunt is present.  IMPRESSION:  Stable pulmonary fibrosis. No definite active process.  Original Report Authenticated By: Dwyane Dee, M.D.  01/22/13- 75 yoF never smoker with  ILD, chronic cough, chronic asthmatic bronchitis, significant hx of varicella encephalitis 2004, Benign normal pressure hydrocephalus/ VP Shunt Son and husband here. ED 2/19 for  Progressive dyspnea. CT initially ?'d organizing pneumonia, but w/o leukocytosis or fever. F/U CXR saw no change in pulmonary fibrosis w/o pneumonia. CT head ?d possible shunt malfunction. Hospitalist reviewed with NSGY and they concluded no significant change. I discussed subsequently with patient's daughter-in-law Brandi [name]/ PA. Patient has been using home O2  FOLLOWS FOR: recently in hospital in February; As long as she is controlling her pace her breathing is okay-still has SOB with exertion. Also, states her cough as gotten better as well.;  Discuss 2 Rxs to make up Dymista and pred RX -where to go from here as far a tx. Doing well at home now with PT/OT. She checks her own oxygen saturation at night- 90%. Desaturates on room air/exertion to 88% but room air/rest 90%. We discussed indications for O2. She is going back to her neurosurgeon in [place] soon. She is distinctly more short of breath with exertion now than she was last fall. Cough had been a problem at last visit but is now gone. CT chest 12/27/12-images reviewed and discussed with her and her family IMPRESSION:  1. No evidence for pulmonary embolus.  2. Progression of interstitial lung disease. This is most  compatible with UIP. The peripheral opacities suggest the  possibility of a second process such as the organizing pneumonia.  3. No acute abnormality.  Original Report Authenticated By: Marin Roberts, M.D. CXR 01/18/13 IMPRESSION:  No significant change compared to prior exam. Findings consistent  with pulmonary fibrosis.  Original Report Authenticated By: Lupita Raider., M.D.  Review of Systems-see HPI Constitutional:   No-   weight loss, night sweats, fevers, chills, fatigue, lassitude. HEENT:   No-  headaches, difficulty swallowing, tooth/dental problems, sore throat,       No-  sneezing, itching, ear ache, nasal congestion, post nasal drip, +hard of hearing CV:  No-   chest pain, orthopnea, PND, swelling in lower extremities, anasarca, dizziness, palpitations Resp: + shortness of breath with exertion or at rest.              +   productive cough,  +Mild non-productive cough,  No-  coughing up  of blood.              No-   change in color of mucus.  No- wheezing.   Skin: No-   rash or lesions. GI:  No-   heartburn, indigestion, abdominal pain, nausea, vomiting, GU:  MS:  No-   joint pain or swelling. . Neuro- no change in long term sequelae of her meningitis.  Psych:  No- change in mood or affect. No depression or anxiety.  No memory loss.    Objective:   Physical Exam BP 108/66  Pulse 124  Ht 5\' 8"  (1.727 m)  Wt 145 lb 9.6 oz (66.044 kg)  BMI 22.14 kg/m2  SpO2 94% General- Alert, Oriented, Affect-appropriate, Distress- none acute, talkative and appropriate. Rolling walker. Room air. Skin- rash-none, lesions- none, excoriation- none Lymphadenopathy- none Head- atraumatic            Eyes- Looks mainly with her good eye- squints with left. conjunctivae clear secretions            Ears- +hard of hearing            Nose- Clear, No-Septal dev, mucus, polyps, erosion, perforation             Throat- Mallampati II , mucosa clear , drainage- none, tonsils- atrophic Neck- flexible , trachea midline, no stridor , thyroid nl, carotid no bruit. + Shunt right neck Chest - symmetrical excursion , unlabored           Heart/CV- RRR-rapid , no murmur , no gallop  , no rub, nl s1 s2                           - JVD- none , edema- none, stasis changes- none, varices- none           Lung- + diffuse squeaks, mild tachypnea, wheeze- none, cough+ dry , dullness-none, rub- none           Chest wall-  Abd- Br/ Gen/ Rectal- Not done, not indicated Extrem- cyanosis- none, clubbing, none, atrophy- none, strength- nl Neuro- grossly intact to observation- except that she needs a walker and turns to face with better eye. L lid droop

## 2013-01-22 NOTE — Patient Instructions (Addendum)
I would use O2 more rather than less. Don't try to tolerate shortness of breath - that just works your heart too hard. Use your oxygen if you need it to  Keep O2 saturation over 90%.   Ok to use an antihistamine like Zyrtec.  Script to try Flonase/ fluticasone nasal spray  Instead of Dymista   Script sent to [facility]  Reduce prednisone to 1.5 x prednisone 20 mg=  30 mg daily  Script for Advair 250- 3 month supply for mail order  Use either acid blocker- I don't think you need both.

## 2013-01-23 NOTE — Telephone Encounter (Signed)
4/28 should be ok.

## 2013-01-23 NOTE — Telephone Encounter (Signed)
lmtcb

## 2013-01-24 ENCOUNTER — Encounter: Payer: Self-pay | Admitting: Internal Medicine

## 2013-01-24 NOTE — Telephone Encounter (Signed)
Patient son aware that 03/05/13 945 appt is okay per CY.  Son to call if anything needed sooner than this scheduled appt.

## 2013-01-26 NOTE — Assessment & Plan Note (Signed)
She is going to see her Brandi [name] neurosurgeon soon and will discuss establishing a neurosurgeon in [place] if needed.

## 2013-01-26 NOTE — Assessment & Plan Note (Addendum)
Developing hypoxic chronic respiratory failure. Not an obvious active bacterial pneumonia now. She may have had a viral illness to precipitate last hospitalization. Plan-we had a long discussion of pulmonary fibrosis, lack of success with therapy, potential availability of perfenadone for trial. I brought up the issue of hospice again that consideration and dialogue within the family. Discussed tapering prednisone,  management of inhalers.

## 2013-01-30 ENCOUNTER — Telehealth: Payer: Self-pay | Admitting: Internal Medicine

## 2013-01-30 NOTE — Telephone Encounter (Signed)
Patient Instructions    I would use O2 more rather than less. Don't try to tolerate shortness of breath - that just works your heart too hard. Use your oxygen if you need it to Keep O2 saturation over 90%.  Ok to use an antihistamine like Zyrtec.  Script to try Flonase/ fluticasone nasal spray Instead of Dymista [facility]  Reduce prednisone to 1.5 x prednisone 20 mg= 30 mg daily   --- I spoke with pt and she is wanting to know how she is to get the half tablet. I advised her to break the tablet in half. She voiced her understanding and needed nothing further

## 2013-02-02 ENCOUNTER — Telehealth: Payer: Self-pay | Admitting: Internal Medicine

## 2013-02-02 DIAGNOSIS — J8410 Pulmonary fibrosis, unspecified: Secondary | ICD-10-CM

## 2013-02-02 NOTE — Telephone Encounter (Signed)
ATC Mary at [facility], no answer, message states caller unavailable at this time. No option to leave message. Carron Curie, CMA

## 2013-02-05 NOTE — Telephone Encounter (Signed)
ATC Mary with [facility] answer and unable to leave voicemail; will need to try again later.

## 2013-02-06 NOTE — Telephone Encounter (Signed)
ATC Mary with [facility] - Unable to leave message - Will try again later

## 2013-02-08 NOTE — Telephone Encounter (Signed)
ATC Mary with [facility] - could not leave message, will need to try back later

## 2013-02-09 NOTE — Telephone Encounter (Signed)
ATC Mary. No answer, msg received "the wireless customer you are calling is unavailable' Will need call back

## 2013-02-12 NOTE — Telephone Encounter (Signed)
Ok to order DME liquid ( continuous and portable) O2 system for dx pulmonary fibrosis. Same O2 rate as w/ her current O2.

## 2013-02-12 NOTE — Telephone Encounter (Signed)
Order placed. Jennifer Castillo, CMA  

## 2013-02-12 NOTE — Telephone Encounter (Signed)
I spoke with the pt spouse and he is requesting that an order be sent to set the pt up on portable liquid oxygen. They have to travel to [place] for some of her doctors appts and they need something that will last longer then the portable tanks they have now. Please advise if ok to send order to [facility] for liquid oxygen. Thanks.Carron Curie, CMA

## 2013-03-05 ENCOUNTER — Ambulatory Visit (INDEPENDENT_AMBULATORY_CARE_PROVIDER_SITE_OTHER): Payer: Medicare Other | Admitting: Internal Medicine

## 2013-03-05 ENCOUNTER — Encounter: Payer: Self-pay | Admitting: Internal Medicine

## 2013-03-05 VITALS — BP 126/78 | HR 103 | Ht 68.0 in | Wt 148.2 lb

## 2013-03-05 DIAGNOSIS — J8410 Pulmonary fibrosis, unspecified: Secondary | ICD-10-CM

## 2013-03-05 DIAGNOSIS — J849 Interstitial pulmonary disease, unspecified: Secondary | ICD-10-CM

## 2013-03-05 NOTE — Progress Notes (Signed)
Patient ID: Brandi [name], female    DOB: 02-27-1933, 79 y.o.   MRN: [id number]  HPI 04/07/11 62 yoF never smoker swith chronic cough, chronic asthmatic bronchitis, significant hx of varicella encephalitis 2004. Husband here. Last here December 04, 2010.  Family member, a PA-C sends a note asking if cough might be from the old nerve inflammation, since it became noticeable about then, and asks about a trial of neurontin.  PFT 03/28/09- wnl. CXR  had looked like COPD with some scarring.  There is question of whether she may have hydrocephalus and this is being evaluated through a neurologist in [place] for potential shunt.  She coughs all day, worst in the morning, but not productive. Failed tessalon and all inhalers. Not dyspneic. Some seasonal postnasal drip. Denies any relation to food or swallowing. Codeine and vicodin give nausea and vomiting.   06/15/11-  [name] never smoker with chronic cough, chronic asthmatic bronchitis, significant hx of varicella encephalitis 2004, Benign normal pressure hydrocephalus.        Husband here. V-P shunt placed in July for hydrocephalus.. Within 2 days her cough had basically stopped and her thinking is much better.  We discussed how to stop Advair and Singulair for observation. CT chest 05/03/11- diffuse patchy peripheral interstitial lung disease. I reviewed the images with them and discussed significance.  11/01/11- [name] never smoker with chronic cough, chronic asthmatic bronchitis, significant hx of varicella encephalitis 2004, Benign normal pressure hydrocephalus/ VP Shunt, ILD Daughter-in-law here. Interstitial lung disease was documented by CT scan in June. Subsequently she had an acute bronchitis with cough leading to a followup chest CT scan December 3 which I do not find in our system. Vertebral compression fracture was identified, secondary to cough, in October. Since then she has done better. Cough is now "not bad", nonproductive or scant clear  phlegm in the morning. Short of breath only proportional to exertion. She stopped Advair. Avelox caused hallucination and confusion. She and family are pleased with her overall status now.  06/26/12- [name] never smoker with chronic cough, chronic asthmatic bronchitis, significant hx of varicella encephalitis 2004, Benign normal pressure hydrocephalus/ VP Shunt, ILD.        Husband here Chest feels comfortable. She does cough productively in the mornings for a while> Clear white mucus, and then again when first lying down. She says she coughs especially if nervous or upset. Mild DOE with more than usual exertion. Since last here she reports multiple CT scans, mostly of her head but including her chest, related to her VP shunt management by neurosurgery in [place]. She is not aware of aspiration events related to her surgery, or of reflux events now at home. CT chest 10/29/11 IMPRESSION:  1. Pulmonary parenchymal pattern of peribronchovascular and  subpleural densities, traction bronchiectasis/bronchiolectasis and  subpleural reticulation, without zonal predominance, consistent  with interstitial lung disease. Differential diagnosis includes  chronic hypersensitivity pneumonitis and chronic eosinophilic  pneumonia.  2. Coronary artery calcification  3. Upper lumbar vertebral body inferior endplate compression  fracture is new from 09/06/2011.  Original Report Authenticated By: Brandi [name], M.D.   12/18/12- [name] never smoker with  ILD, chronic cough, chronic asthmatic bronchitis, significant hx of varicella encephalitis 2004, Benign normal pressure hydrocephalus/ VP Shunt,. 2 daughter's here. FOLLOWS JYN:WGNFAOZHY SOB since last "fall season" and getting worse; steroid was given by Dr Cliffton Asters and helped with cough as well.  11-29-12 tx'd by Dr Katrinka Blazing for bronchitis(CXR done). Breathing has  not getten better per daughter.  12-01-12 was tx'd  by Dr Jerilee Field abx from Ceftin(made patient sick to  stomach) to Zpak and prednisone. Pt states she started back on Advair and has helped slightly. Hard to breathe deep; sinus drainage and productive cough-cloudy in color and sticky. Cough has been more labored especially in the last 2 months with increased dyspnea on exertion. She blames part of the variability of her status on her neurologic problem/history encephalitis/aunt. Higher dose prednisone helped cough which returned as she tapered dose blow about 40 mg daily. There was a distinct extra exacerbation in January associated with a viral URI syndrome. Daughter's have to help her use Advair technique appropriately. It is helpful when used according to directions but she has some trouble getting inhaled medicines deep enough. CXR- 11/30/12- Findings: Coarse and prominent interstitial markings remain  throughout the lungs right more prominently than left most  consistent with pulmonary fibrosis. No active process is noted.  The heart is mildly enlarged and stable. Thoracolumbar scoliosis  is noted. A VP shunt is present.  IMPRESSION:  Stable pulmonary fibrosis. No definite active process.  Original Report Authenticated By: Brandi [name], M.D.  01/22/13- [name] never smoker with  ILD, chronic cough, chronic asthmatic bronchitis, significant hx of varicella encephalitis 2004, Benign normal pressure hydrocephalus/ VP Shunt Son and husband here. ED 2/19 for  Progressive dyspnea. CT initially ?'d organizing pneumonia, but w/o leukocytosis or fever. F/U CXR saw no change in pulmonary fibrosis w/o pneumonia. CT head ?d possible shunt malfunction. Hospitalist reviewed with NSGY and they concluded no significant change. I discussed subsequently with patient's daughter-in-law Brandi [name]/ PA. Patient has been using home O2  FOLLOWS FOR: recently in hospital in February; As long as she is controlling her pace her breathing is okay-still has SOB with exertion. Also, states her cough as gotten better as well.;  Discuss 2 Rxs to make up Dymista and pred RX -where to go from here as far a tx. Doing well at home now with PT/OT. She checks her own oxygen saturation at night- 90%. Desaturates on room air/exertion to 88% but room air/rest 90%. We discussed indications for O2. She is going back to her neurosurgeon in [place] soon. She is distinctly more short of breath with exertion now than she was last fall. Cough had been a problem at last visit but is now gone. CT chest 12/27/12-images reviewed and discussed with her and her family IMPRESSION:  1. No evidence for pulmonary embolus.  2. Progression of interstitial lung disease. This is most  compatible with UIP. The peripheral opacities suggest the  possibility of a second process such as the organizing pneumonia.  3. No acute abnormality.  Original Report Authenticated By: Marin Roberts, M.D. CXR 01/18/13 IMPRESSION:  No significant change compared to prior exam. Findings consistent  with pulmonary fibrosis.  Original Report Authenticated By: Lupita Raider., M.D.  03/05/13- 86 yoF never smoker with  ILD, chronic cough, chronic asthmatic bronchitis, significant hx of varicella encephalitis 2004, Benign normal pressure hydrocephalus/ VP Shunt.  Husband and dtr here. FOLLOWS FOR: using O2 as told-to keep sats at or above 90%; learning to not tolerate SOB. Using O2 through the night-makes her feel better and more rested the next day. Slight increase in cough due to pollen, otherwise doing well. Pt states she is exercising every day and napping twice a day.  We did decrease prednisone to 30 mg daily as she continues Advair 250 oxygen at 2 L for sleep/[facility].  Oxygen saturation running 95-98% with O2. Discussed her followup visit with her neurosurgeon in [place] who manages her VP shunt.  Review of Systems-see HPI Constitutional:   No-   weight loss, night sweats, fevers, chills, fatigue, lassitude. HEENT:   No-  headaches, difficulty swallowing,  tooth/dental problems, sore throat,       No-  sneezing, itching, ear ache, nasal congestion, post nasal drip, +hard of hearing CV:  No-   chest pain, orthopnea, PND, swelling in lower extremities, anasarca, dizziness, palpitations Resp: + shortness of breath with exertion or at rest.              +   productive cough,  +Mild non-productive cough,  No-  coughing up of blood.              No-   change in color of mucus.  No- wheezing.   Skin: No-   rash or lesions. GI:  No-   heartburn, indigestion, abdominal pain, nausea, vomiting, GU:  MS:  No-   joint pain or swelling. . Neuro- no change in long term sequelae of her meningitis.  Psych:  No- change in mood or affect. No depression or anxiety.  No memory loss.   Objective:   Physical Exam  General- Alert, Oriented, Affect-appropriate, Distress- none acute, talkative and appropriate. Rolling walker. O2 2L Skin- rash-none, lesions- none, excoriation- none Lymphadenopathy- none Head- atraumatic            Eyes- Looks mainly with her good eye- squints with left. conjunctivae clear secretions            Ears- +hard of hearing            Nose- Clear, No-Septal dev, mucus, polyps, erosion, perforation             Throat- Mallampati II , mucosa clear , drainage- none, tonsils- atrophic Neck- flexible , trachea midline, no stridor , thyroid nl, carotid no bruit. + Shunt right neck Chest - symmetrical excursion , unlabored           Heart/CV- RRR-rapid , no murmur , no gallop  , no rub, nl s1 s2                           - JVD- none , edema- none, stasis changes- none, varices- none           Lung- + diffuse crackles, mild tachypnea, wheeze- none, cough+ dry , dullness-none, rub- none           Chest wall-  Abd- Br/ Gen/ Rectal- Not done, not indicated Extrem- cyanosis- none, clubbing, none, atrophy- none, strength- nl Neuro- grossly intact to observation- except that she needs a walker and turns to face with better eye. L lid  droop

## 2013-03-05 NOTE — Patient Instructions (Addendum)
Order- DME [facility] Portable O2 concentrator for travel   2L/min pulse   Dx Pulmonary fibrosis  Reduce prednisone to 20 mg daily  Ok to d/c home health nurse for now.

## 2013-03-13 ENCOUNTER — Other Ambulatory Visit: Payer: Self-pay | Admitting: Internal Medicine

## 2013-03-13 NOTE — Assessment & Plan Note (Signed)
Pulmonary status is not changing quickly. Her neurologic status is more labile, affected by the functioning of her VP shunt. This caused increased weakness during the late winter and early spring. Plan-will talk with her home care company about a portable oxygen concentrator for travel. Reduce prednisone from 30 mg to 20 mg daily. We had a discussion of Perfenadone. I will pass her name forward for consideration.

## 2013-03-27 ENCOUNTER — Telehealth: Payer: Self-pay | Admitting: Internal Medicine

## 2013-03-27 NOTE — Telephone Encounter (Signed)
Per CY-he and patient has discussed this previously; she can use her O2 at 2-4 L/M continuous adjusting as needed.

## 2013-03-27 NOTE — Telephone Encounter (Signed)
i spoke with [facility] and is aware. Nothing further was needed

## 2013-03-28 ENCOUNTER — Telehealth: Payer: Self-pay | Admitting: Internal Medicine

## 2013-03-28 NOTE — Telephone Encounter (Signed)
I spoke with [place]ed and he is requesting to speak with CDY personally. He states he will be respectful of Dr. Roxy Cedar time. He states he wants to discuss pt's condition and her prednisone/O2. He states pt is going downhill and doesn't feel like pt will be here much longer. He states he can be reached at the # listed above day/night. Please advise Dr. Maple Hudson thanks

## 2013-03-29 ENCOUNTER — Telehealth: Payer: Self-pay | Admitting: Internal Medicine

## 2013-03-29 NOTE — Telephone Encounter (Signed)
Son [place] called. Mother weaker, desaturating more easily even on O2, coughing more. We discussed effect of prednisone wean and agreed to continue Pred 20 mg daily for 1 more week. Then if not stabilizing on that dose, they will call me about going back up to ?30 or 40 mg daily. Discussed Hospice. He will make contact with Hospice to learn about the program.

## 2013-03-29 NOTE — Telephone Encounter (Signed)
Done. Charted phone note

## 2013-04-10 ENCOUNTER — Telehealth: Payer: Self-pay | Admitting: Internal Medicine

## 2013-04-10 NOTE — Telephone Encounter (Signed)
Ronny Bacon, CMA at 03/27/2013 3:33 PM   Status: Signed            Per CY-he and patient has discussed this previously; she can use her O2 at 2-4 L/M continuous adjusting as needed  ----  I spoke with Melissa and advised her of this. She voiced her understanding and needed nothing further

## 2013-04-11 ENCOUNTER — Telehealth: Payer: Self-pay | Admitting: Internal Medicine

## 2013-04-11 DIAGNOSIS — J8410 Pulmonary fibrosis, unspecified: Secondary | ICD-10-CM

## 2013-04-11 DIAGNOSIS — IMO0001 Reserved for inherently not codable concepts without codable children: Secondary | ICD-10-CM

## 2013-04-11 MED ORDER — PREDNISONE 20 MG PO TABS: ORAL_TABLET | ORAL | Status: DC

## 2013-04-11 NOTE — Telephone Encounter (Signed)
Order- [facility]ME [facility]- change to high output Oxygen concentrator to allow 4- 7 L/min dx interstitial fibrosis

## 2013-04-11 NOTE — Telephone Encounter (Signed)
Spoke with pt's spouse and notified of recs per CDY Order was sent to [facility] Staff msg sent to [facility]

## 2013-04-11 NOTE — Telephone Encounter (Signed)
Per CY-try increasing Prednisone to 40 mg daily.

## 2013-04-11 NOTE — Telephone Encounter (Signed)
Called, spoke with pt's son, Brandi [name].  Per phone msg from 03/29/13:  Waymon Budge, MD at 03/29/2013 1:20 PM   Status: Signed            Son Brandi [name] called. Mother weaker, desaturating more easily even on O2, coughing more. We discussed effect of prednisone wean and agreed to continue Pred 20 mg daily for 1 more week. Then if not stabilizing on that dose, they will call me about going back up to ?30 or 40 mg daily.  Discussed Hospice. He will make contact with Hospice to learn about the program.    -----  Brandi [name] states pt has been on prednisone 20 mg for 3.5 wks now and isn't doing any better.  States she has an increased nonprod cough and o2 sats are now "dropping like a rock" when she stands up.  States her o2 sats drop into the 70s on 4lpm when pt is standing.  No wheezing and no c/o chest tightness or chest pain from pt.  He would like to know if prednisone should be increased or if CDY has any further recs.  Dr. Maple Hudson, pls advise.  Thank you.

## 2013-04-11 NOTE — Telephone Encounter (Signed)
I spoke with spouse and he is requesting for pt to have a concentrator that goes up to 10 L. Per spouse it takes to long to recharge the current one pt has bc she uses it at 4 liters or more most of the time. He asks that we send this to [facility]. Please advise Dr. Maple Hudson thanks

## 2013-04-11 NOTE — Telephone Encounter (Signed)
Ned aware of results he asked that we call in RX. Nothing further was needed

## 2013-06-04 ENCOUNTER — Encounter: Payer: Self-pay | Admitting: Internal Medicine

## 2013-06-04 ENCOUNTER — Ambulatory Visit (INDEPENDENT_AMBULATORY_CARE_PROVIDER_SITE_OTHER): Payer: Medicare Other | Admitting: Internal Medicine

## 2013-06-04 ENCOUNTER — Ambulatory Visit (INDEPENDENT_AMBULATORY_CARE_PROVIDER_SITE_OTHER)
Admission: RE | Admit: 2013-06-04 | Discharge: 2013-06-04 | Disposition: A | Discharge status: Home / Self Care | Payer: Medicare Other | Source: Ambulatory Visit | Attending: Internal Medicine | Admitting: Internal Medicine

## 2013-06-04 VITALS — BP 112/84 | HR 111 | Ht 68.0 in | Wt 147.4 lb

## 2013-06-04 DIAGNOSIS — J9610 Chronic respiratory failure, unspecified whether with hypoxia or hypercapnia: Secondary | ICD-10-CM

## 2013-06-04 DIAGNOSIS — Z23 Encounter for immunization: Secondary | ICD-10-CM

## 2013-06-04 DIAGNOSIS — J849 Interstitial pulmonary disease, unspecified: Secondary | ICD-10-CM

## 2013-06-04 DIAGNOSIS — J8410 Pulmonary fibrosis, unspecified: Secondary | ICD-10-CM

## 2013-06-04 MED ORDER — FLUTICASONE-SALMETEROL 250-50 MCG/DOSE IN AEPB: INHALATION_SPRAY | RESPIRATORY_TRACT | Status: AC

## 2013-06-04 MED ORDER — SERTRALINE HCL 100 MG PO TABS: 100.0000 mg | ORAL_TABLET | Freq: Every morning | ORAL | Status: AC

## 2013-06-04 MED ORDER — PREDNISONE 20 MG PO TABS: ORAL_TABLET | ORAL | Status: DC

## 2013-06-04 NOTE — Progress Notes (Signed)
Patient ID: Brandi [name], female    DOB: 1932/12/24, 79 y.o.   MRN: [id number]  HPI 04/07/11 78 yoF never smoker swith chronic cough, chronic asthmatic bronchitis, significant hx of varicella encephalitis 2004. Husband here. Last here December 04, 2010.  Family member, a PA-C sends a note asking if cough might be from the old nerve inflammation, since it became noticeable about then, and asks about a trial of neurontin.  PFT 03/28/09- wnl. CXR  had looked like COPD with some scarring.  There is question of whether she may have hydrocephalus and this is being evaluated through a neurologist in [place] for potential shunt.  She coughs all day, worst in the morning, but not productive. Failed tessalon and all inhalers. Not dyspneic. Some seasonal postnasal drip. Denies any relation to food or swallowing. Codeine and vicodin give nausea and vomiting.   06/15/11-  [name] never smoker with chronic cough, chronic asthmatic bronchitis, significant hx of varicella encephalitis 2004, Benign normal pressure hydrocephalus.        Husband here. V-P shunt placed in July for hydrocephalus.. Within 2 days her cough had basically stopped and her thinking is much better.  We discussed how to stop Advair and Singulair for observation. CT chest 05/03/11- diffuse patchy peripheral interstitial lung disease. I reviewed the images with them and discussed significance.  11/01/11- [name] never smoker with chronic cough, chronic asthmatic bronchitis, significant hx of varicella encephalitis 2004, Benign normal pressure hydrocephalus/ VP Shunt, ILD Daughter-in-law here. Interstitial lung disease was documented by CT scan in June. Subsequently she had an acute bronchitis with cough leading to a followup chest CT scan December 3 which I do not find in our system. Vertebral compression fracture was identified, secondary to cough, in October. Since then she has done better. Cough is now "not bad", nonproductive or scant clear  phlegm in the morning. Short of breath only proportional to exertion. She stopped Advair. Avelox caused hallucination and confusion. She and family are pleased with her overall status now.  06/26/12- 78 yoF never smoker with chronic cough, chronic asthmatic bronchitis, significant hx of varicella encephalitis 2004, Benign normal pressure hydrocephalus/ VP Shunt, ILD.        Husband here Chest feels comfortable. She does cough productively in the mornings for a while> Clear white mucus, and then again when first lying down. She says she coughs especially if nervous or upset. Mild DOE with more than usual exertion. Since last here she reports multiple CT scans, mostly of her head but including her chest, related to her VP shunt management by neurosurgery in [place]. She is not aware of aspiration events related to her surgery, or of reflux events now at home. CT chest 10/29/11 IMPRESSION:  1. Pulmonary parenchymal pattern of peribronchovascular and  subpleural densities, traction bronchiectasis/bronchiolectasis and  subpleural reticulation, without zonal predominance, consistent  with interstitial lung disease. Differential diagnosis includes  chronic hypersensitivity pneumonitis and chronic eosinophilic  pneumonia.  2. Coronary artery calcification  3. Upper lumbar vertebral body inferior endplate compression  fracture is new from 09/06/2011.  Original Report Authenticated By: Reyes Ivan, M.D.   12/18/12- [name] never smoker with  ILD, chronic cough, chronic asthmatic bronchitis, significant hx of varicella encephalitis 2004, Benign normal pressure hydrocephalus/ VP Shunt,. 2 daughter's here. FOLLOWS NFA:[id number] SOB since last "fall season" and getting worse; steroid was given by Dr Cliffton Asters and helped with cough as well.  11-29-12 tx'd by Dr Katrinka Blazing for bronchitis(CXR done). Breathing has  not getten better per daughter.  12-01-12 was tx'd  by Dr Jerilee Field abx from Ceftin(made patient sick to  stomach) to Zpak and prednisone. Pt states she started back on Advair and has helped slightly. Hard to breathe deep; sinus drainage and productive cough-cloudy in color and sticky. Cough has been more labored especially in the last 2 months with increased dyspnea on exertion. She blames part of the variability of her status on her neurologic problem/history encephalitis/aunt. Higher dose prednisone helped cough which returned as she tapered dose blow about 40 mg daily. There was a distinct extra exacerbation in January associated with a viral URI syndrome. Daughter's have to help her use Advair technique appropriately. It is helpful when used according to directions but she has some trouble getting inhaled medicines deep enough. CXR- 11/30/12- Findings: Coarse and prominent interstitial markings remain  throughout the lungs right more prominently than left most  consistent with pulmonary fibrosis. No active process is noted.  The heart is mildly enlarged and stable. Thoracolumbar scoliosis  is noted. A VP shunt is present.  IMPRESSION:  Stable pulmonary fibrosis. No definite active process.  Original Report Authenticated By: Dwyane Dee, M.D.  01/22/13- 6 yoF never smoker with  ILD, chronic cough, chronic asthmatic bronchitis, significant hx of varicella encephalitis 2004, Benign normal pressure hydrocephalus/ VP Shunt Son and husband here. ED 2/19 for  Progressive dyspnea. CT initially ?'d organizing pneumonia, but w/o leukocytosis or fever. F/U CXR saw no change in pulmonary fibrosis w/o pneumonia. CT head ?d possible shunt malfunction. Hospitalist reviewed with NSGY and they concluded no significant change. I discussed subsequently with patient's daughter-in-law Maureen/ PA. Patient has been using home O2  FOLLOWS FOR: recently in hospital in February; As long as she is controlling her pace her breathing is okay-still has SOB with exertion. Also, states her cough as gotten better as well.;  Discuss 2 Rxs to make up Dymista and pred RX -where to go from here as far a tx. Doing well at home now with PT/OT. She checks her own oxygen saturation at night- 90%. Desaturates on room air/exertion to 88% but room air/rest 90%. We discussed indications for O2. She is going back to her neurosurgeon in [place] soon. She is distinctly more short of breath with exertion now than she was last fall. Cough had been a problem at last visit but is now gone. CT chest 12/27/12-images reviewed and discussed with her and her family IMPRESSION:  1. No evidence for pulmonary embolus.  2. Progression of interstitial lung disease. This is most  compatible with UIP. The peripheral opacities suggest the  possibility of a second process such as the organizing pneumonia.  3. No acute abnormality.  Original Report Authenticated By: Marin Roberts, M.D. CXR 01/18/13 IMPRESSION:  No significant change compared to prior exam. Findings consistent  with pulmonary fibrosis.  Original Report Authenticated By: Lupita Raider., M.D.  03/05/13- 73 yoF never smoker with  ILD, chronic cough, chronic asthmatic bronchitis, significant hx of varicella encephalitis 2004, Benign normal pressure hydrocephalus/ VP Shunt.  Husband and dtr here. FOLLOWS FOR: using O2 as told-to keep sats at or above 90%; learning to not tolerate SOB. Using O2 through the night-makes her feel better and more rested the next day. Slight increase in cough due to pollen, otherwise doing well. Pt states she is exercising every day and napping twice a day.  We did decrease prednisone to 30 mg daily as she continues Advair 250 oxygen at 2 L for sleep/[facility].  Oxygen saturation running 95-98% with O2. Discussed her followup visit with her neurosurgeon in [place] who manages her VP shunt.  06/04/13- [name] never smoker with  ILD, chronic cough, chronic asthmatic bronchitis, significant hx of varicella encephalitis 2004, Benign normal pressure  hydrocephalus/ VP Shunt.  Husband, son  and dtr here FOLLOWS FOR: pt reports breathing is "a little worse" x 3-4 weeks, increased panting and SOB,dry cough-- denies any wheezing or ct --still exercising although it is a bit more challenging There may be very slight gradual worsening of shortness of breath. Variable dry cough. Stays in air conditioning. Continues oxygen 4 L. Would like to restart home physical therapy. Could not tolerate reduction of prednisone to 20 mg daily and feels that cough and dyspnea, as well as general well-being, require 40 mg daily. We again discussed steroid withdrawal versus therapeutic effects. Her mother died of pulmonary fibrosis. Family had many questions including genetic risk that they might develop this. We talked again about Perfenadone trials.   Review of Systems-see HPI Constitutional:   No-   weight loss, night sweats, fevers, chills, fatigue, lassitude. HEENT:   No-  headaches, difficulty swallowing, tooth/dental problems, sore throat,       No-  sneezing, itching, ear ache, nasal congestion, post nasal drip, +hard of hearing CV:  No-   chest pain, orthopnea, PND, swelling in lower extremities, anasarca, dizziness, palpitations Resp: + shortness of breath with exertion or at rest.              +   productive cough,  +Mild non-productive cough,  No-  coughing up of blood.              No-   change in color of mucus.  No- wheezing.   Skin: No-   rash or lesions. GI:  No-   heartburn, indigestion, abdominal pain, nausea, vomiting, GU:  MS:  No-   joint pain or swelling. . Neuro- no change in long term sequelae of her meningitis.  Psych:  No- change in mood or affect. No depression or anxiety.  No memory loss.   Objective:   Physical Exam  General- Alert, Oriented, Affect-appropriate, Distress- none acute, talkative and appropriate. Rolling walker. O2 2L Skin- rash-none, lesions- none, excoriation- none Lymphadenopathy- none Head- atraumatic             Eyes- Looks mainly with her good eye- squints with left. conjunctivae clear secretions            Ears- +hard of hearing            Nose- Clear, No-Septal dev, mucus, polyps, erosion, perforation             Throat- Mallampati II , mucosa clear , drainage- none, tonsils- atrophic Neck- flexible , trachea midline, no stridor , thyroid nl, carotid no bruit. + Shunt right neck Chest - symmetrical excursion , unlabored           Heart/CV- RRR-rapid , no murmur , no gallop  , no rub, nl s1 s2                           - JVD- none , edema- none, stasis changes- none, varices- none           Lung- + diffuse crackles, mild tachypnea, wheeze- none, cough+ dry , dullness-none, rub- none           Chest wall-  Abd- Br/ Gen/  Rectal- Not done, not indicated Extrem- cyanosis- none, clubbing, none, atrophy- none, strength- nl Neuro- grossly intact to observation- except that she needs a walker and turns to face with better eye. L lid droop

## 2013-06-04 NOTE — Patient Instructions (Addendum)
We do want to use the lowest prednisone dose that you can get by with. Try some days just taking 30 mg instead of 40.  I will discuss the genetic testing issue for the family  Order CXR- dx pulmonary fibrosis  Pneumovax  Order- [facility]-  Home Physical Therapy evaluate and treat  Deconditioning related to pulmonary fibrosis and chronic respiratory failure

## 2013-06-16 NOTE — Assessment & Plan Note (Signed)
Discussed oxygen delivery devices for mobility

## 2013-06-16 NOTE — Assessment & Plan Note (Signed)
This is acting like idiopathic pulmonary fibrosis. She is outside of the treatment protocol for perfenadone trial- discussed with research nurse.  Plan-chest x-ray, pneumonia vaccine. Try slight lowering of prednisone dose as tolerated. Discussion of steroid insufficiency and withdrawal. [facility] home health nurse- assess for home PT eval and treat for weakness and mobility.

## 2013-07-10 ENCOUNTER — Other Ambulatory Visit: Payer: Self-pay | Admitting: Internal Medicine

## 2013-07-11 ENCOUNTER — Telehealth: Payer: Self-pay | Admitting: Internal Medicine

## 2013-07-11 MED ORDER — MOMETASONE FURO-FORMOTEROL FUM 100-5 MCG/ACT IN AERO: 2.0000 | INHALATION_SPRAY | Freq: Two times a day (BID) | RESPIRATORY_TRACT | Status: AC

## 2013-07-11 NOTE — Telephone Encounter (Signed)
Ok to change Advair to Rx Dulera 100   # 3    2 puffs then rinse mouth, twice daily, refill x 3

## 2013-07-11 NOTE — Telephone Encounter (Signed)
Pt aware of recs. Sample placed upfront for pick up. She does not want RX just yet.

## 2013-07-11 NOTE — Telephone Encounter (Signed)
I spoke with pt. She stated the advait 250-50 is tier 3 on formulary. The dulera is 2nd tier so she is wanting to know if she can try this instead. Pt usually gets advair 3 months supply but prior to getting this she was giving sample. Please advise Dr. Maple Hudson thanks Last OV 06/04/13 Pending 2013-10-31 Allergies  Allergen Reactions  . Avelox [Moxifloxacin Hydrochloride] Other (See Comments)    Hallucinations, confusion  . Codeine Nausea And Vomiting  . Hydrocodone-Acetaminophen Nausea And Vomiting  . Sulfonamide Derivatives Other (See Comments)    "tingling sensation"

## 2013-09-13 ENCOUNTER — Other Ambulatory Visit: Payer: Self-pay

## 2013-10-08 ENCOUNTER — Telehealth: Payer: Self-pay | Admitting: Internal Medicine

## 2013-10-08 NOTE — Telephone Encounter (Signed)
Noted  

## 2013-10-08 NOTE — Telephone Encounter (Signed)
I spoke with pt son. This was FYI for [place].

## 2013-10-15 ENCOUNTER — Ambulatory Visit: Payer: Medicare Other | Admitting: Internal Medicine

## 2013-11-08 DEATH — deceased

## 2013-11-26 ENCOUNTER — Telehealth: Payer: Self-pay

## 2013-11-26 NOTE — Telephone Encounter (Signed)
Patient past away per Obituary in [place] [employer] °

## 2013-12-23 IMAGING — CT CT ANGIO CHEST
1 of 2 series · 19 of 32 positions shown · IV contrast (OMNIPAQUE 300)
Comparison: Two-view chest x-ray 12/27/2012.

CLINICAL DATA: Shortness of breath.

CT ANGIOGRAPHY CHEST
TECHNIQUE: Multidetector CT imaging of the chest using the
standard protocol during bolus administration of intravenous
contrast. Multiplanar reconstructed images including MIPs were
obtained and reviewed to evaluate the vascular anatomy.
Contrast: 100mL OMNIPAQUE IOHEXOL 350 MG/ML SOLN

[Series 7: thins for pacs · axial · 0.74mm/px · z∈[+1548,+1770]mm · 19 of 248 slices shown]
[im 13/248  lung]
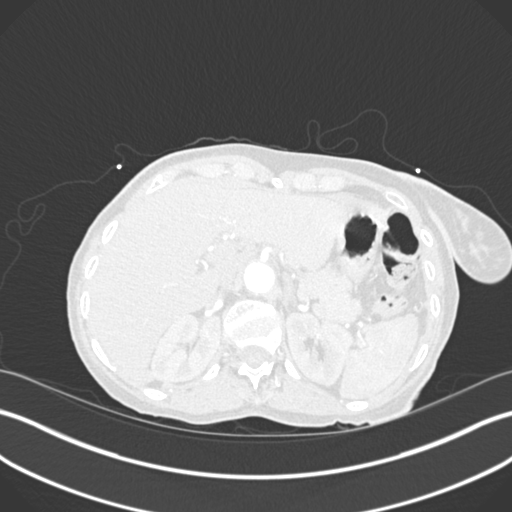
[im 25/248  mediastinal]
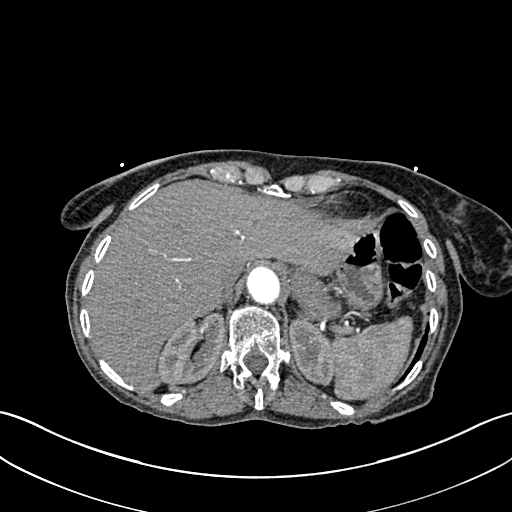
[im 38/248  lung]
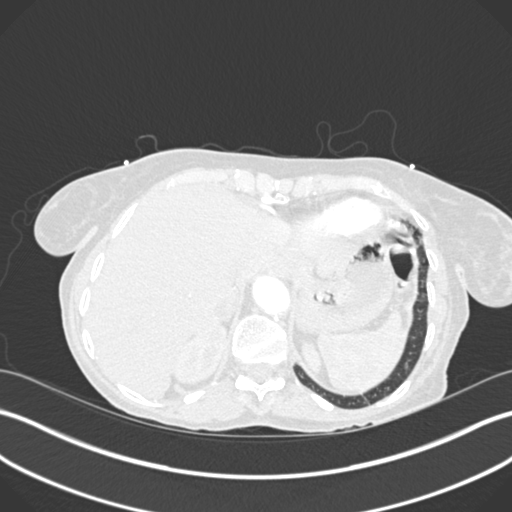
[im 62/248  mediastinal]
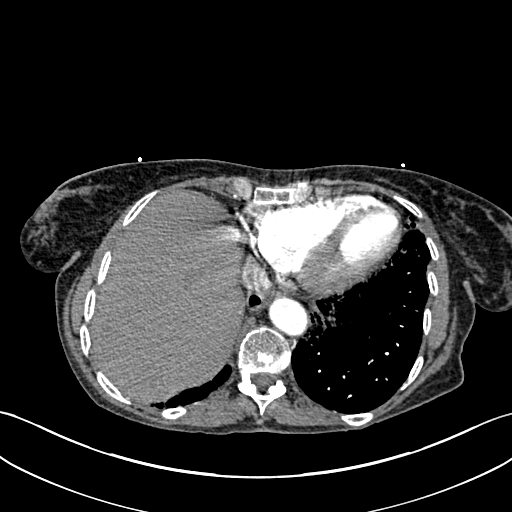
[im 75/248  lung]
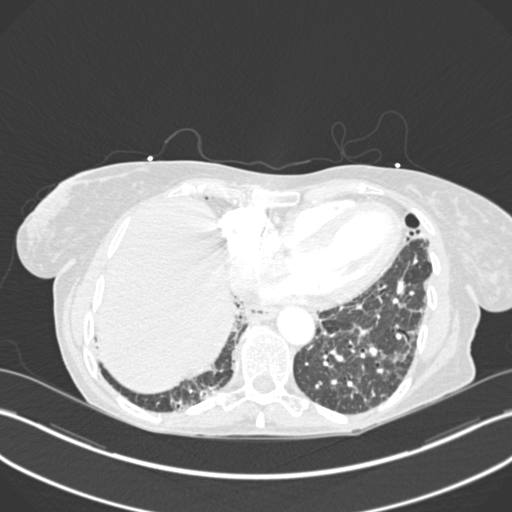
[im 83/248  mediastinal]
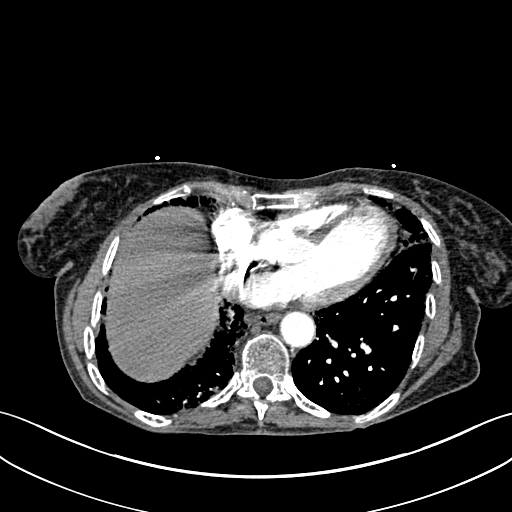
[im 87/248  lung]
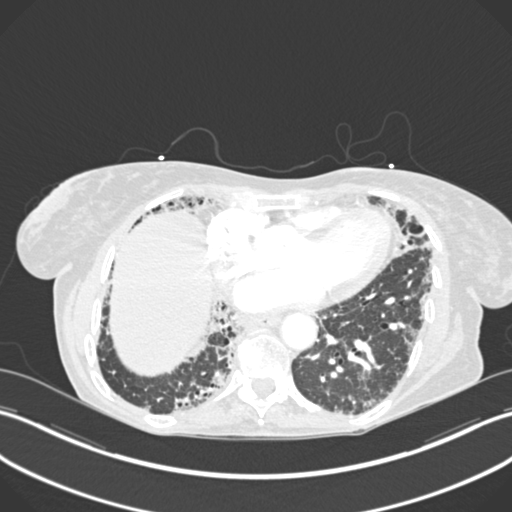
[im 99/248  mediastinal]
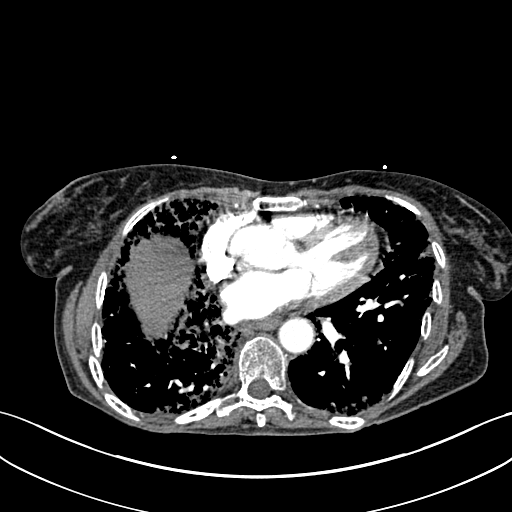
[im 112/248  lung]
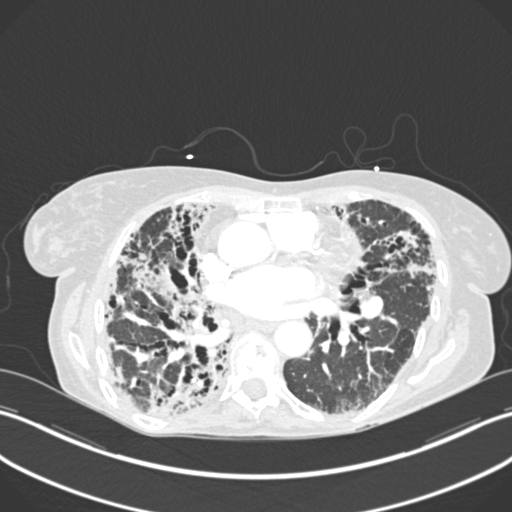
[im 124/248  mediastinal]
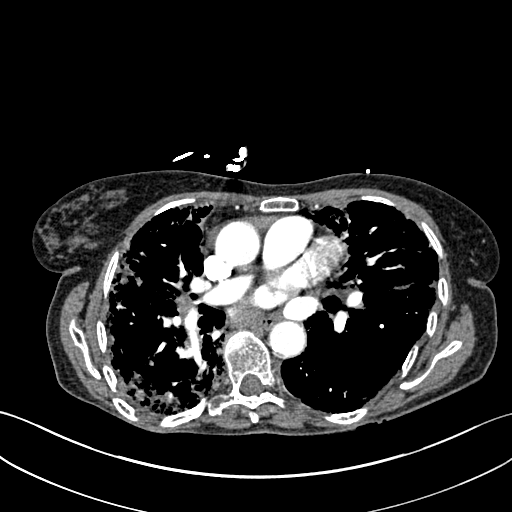
[im 136/248  lung]
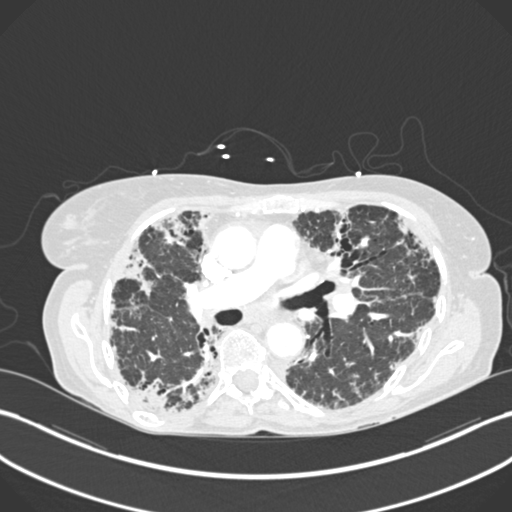
[im 149/248  mediastinal]
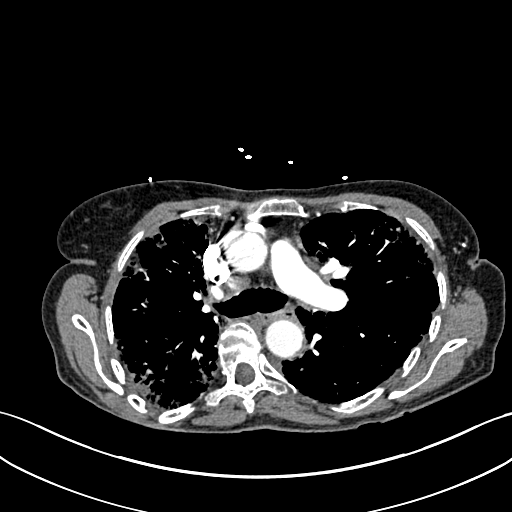
[im 161/248  lung]
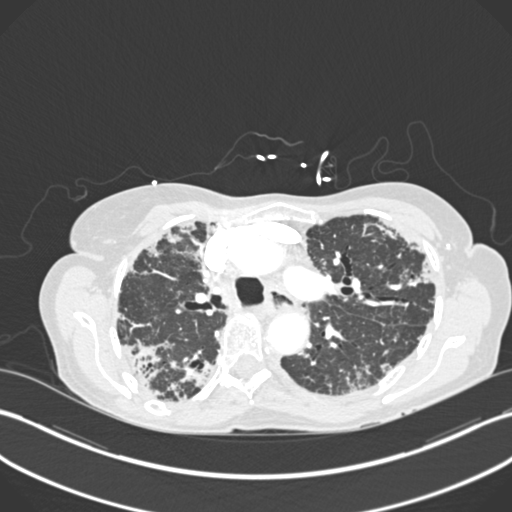
[im 165/248  mediastinal]
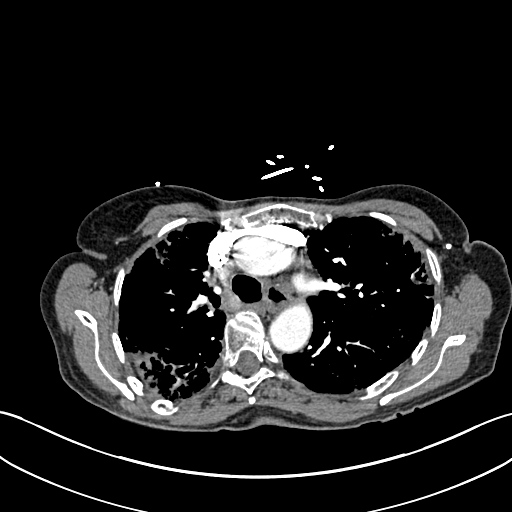
[im 173/248  lung]
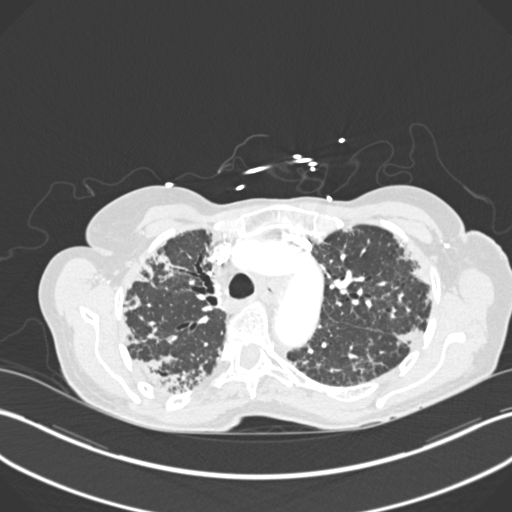
[im 186/248  mediastinal]
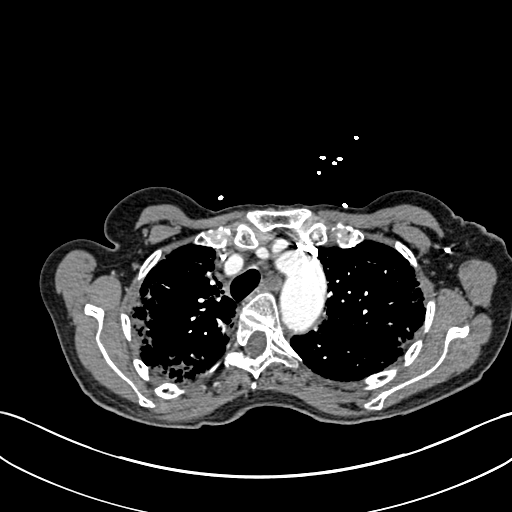
[im 210/248  lung]
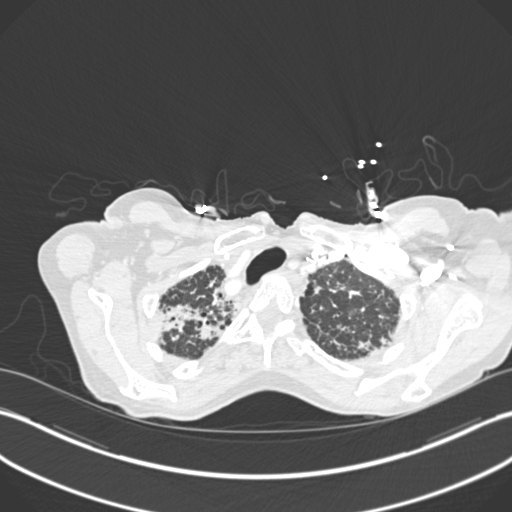
[im 223/248  mediastinal]
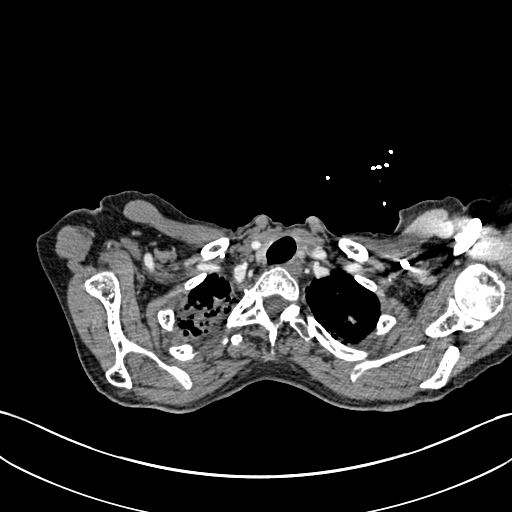
[im 235/248  lung]
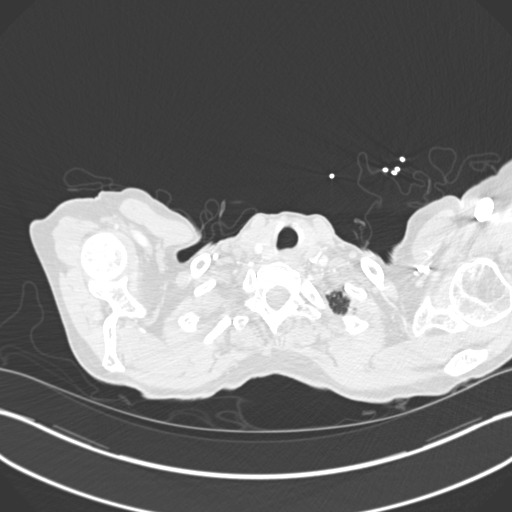

[19 of 32 positions shown; findings below may reference images not displayed]

FINDINGS: Pulmonary arterial opacification is satisfactory.  No
focal filling defects are present to suggest pulmonary embolus.

The heart size is normal.  No significant mediastinal or axillary
adenopathy is present.  There is no significant pleural or
pericardial effusion.

Limited imaging of the upper abdomen is unremarkable.

Lung windows demonstrate extensive traction bronchiectasis.  There
is progression of height, particularly in the right middle lobe.
The peripheral opacities are noted bilaterally.  This all falls in
the similar distribution as on the prior study.
IMPRESSION: 1.  No evidence for pulmonary embolus.
2.  Progression of interstitial lung disease.  This is most
compatible with UIP.  The peripheral opacities suggest the
possibility of a second process such as the organizing pneumonia.
3.  No acute abnormality.

## 2013-12-24 IMAGING — CR DG CHEST 1V PORT
1 series · 1 of 1 positions shown · non-contrast
Comparison: CT scan 12/27/2012.  Plain films of the chest
12/27/2012 and 06/26/2012.

CLINICAL DATA: Shortness of breath and cough.

PORTABLE CHEST - 1 VIEW

[AP]
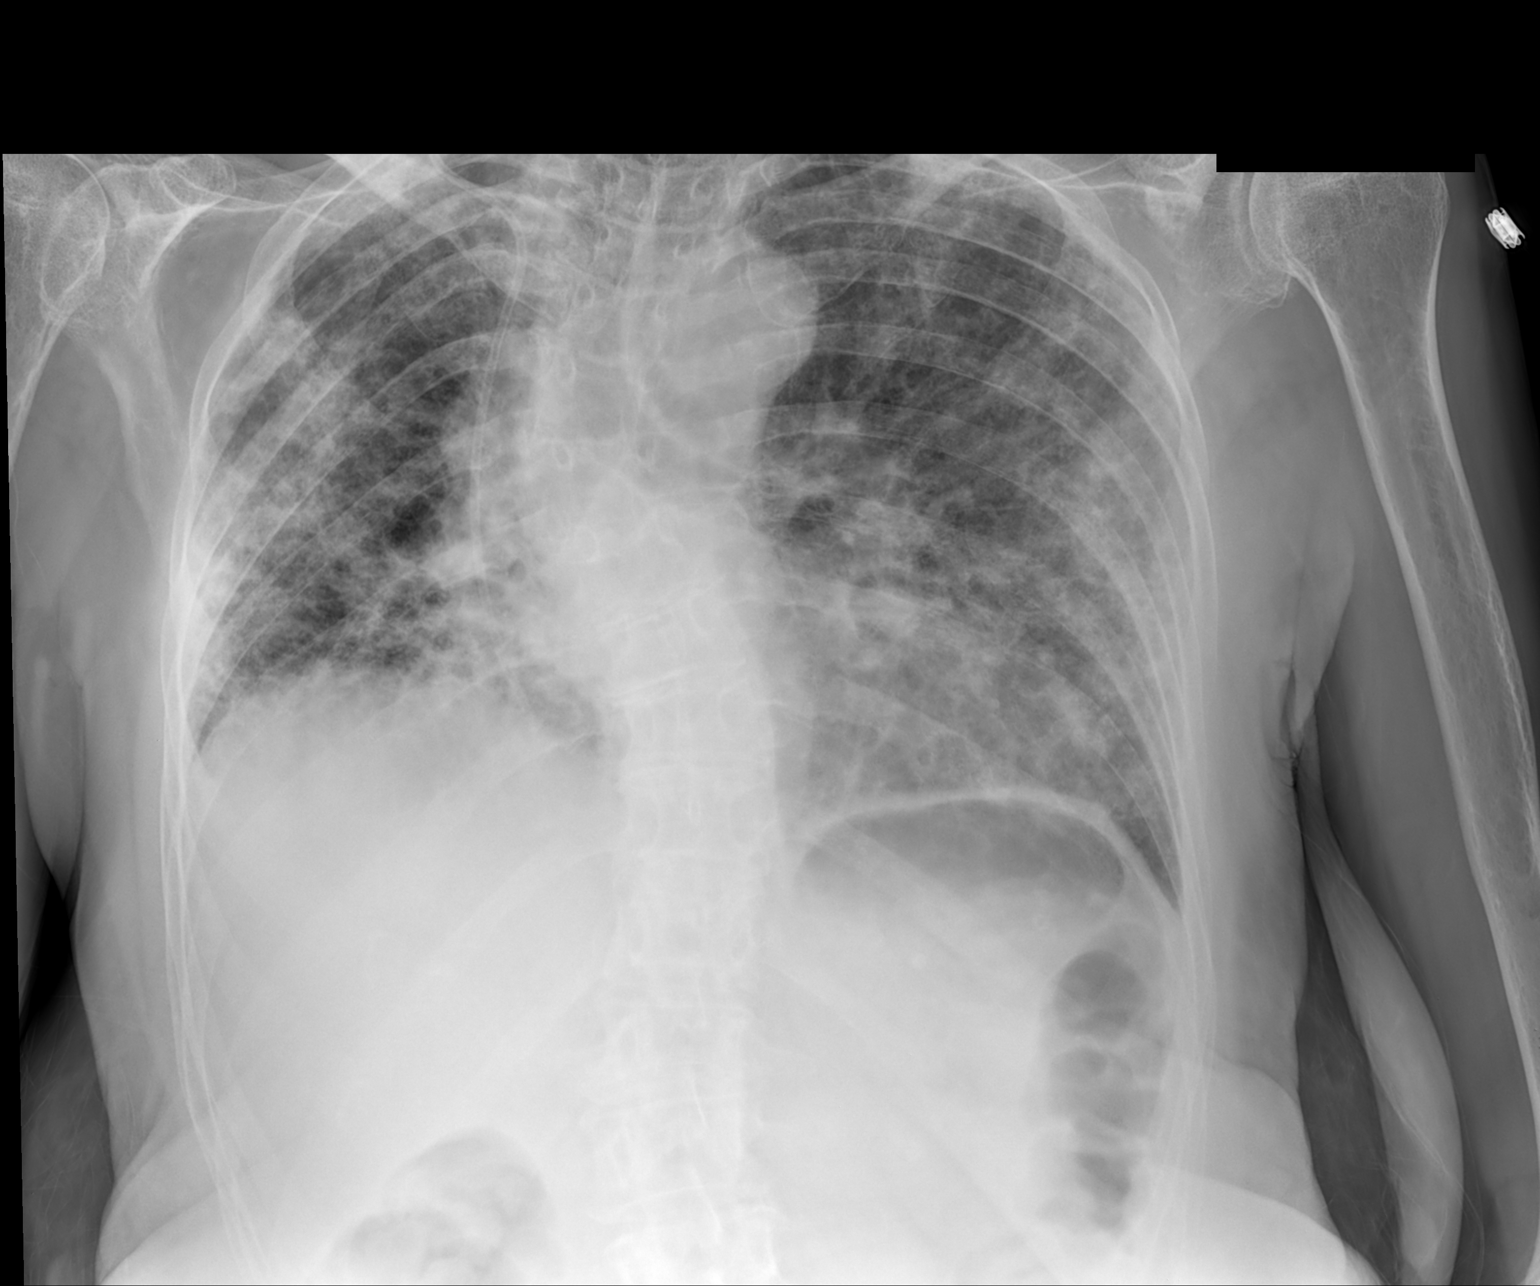

[1 of 1 positions shown; findings below may reference images not displayed]

FINDINGS: Extensive bilateral airspace disease and changes of
pulmonary fibrosis do not appear changed since the most recent
plain films.  No pneumothorax or pleural fluid is seen.  Heart size
is normal.  Scoliosis is noted.
IMPRESSION: No change in pulmonary fibrosis and possible superimposed
pneumonia.
